# Patient Record
Sex: Male | Born: 1960 | Race: Black or African American | Hispanic: No | Marital: Married
Health system: Southern US, Community
[De-identification: ages and names within clinical notes are randomized; demographics above are authoritative.]

## PROBLEM LIST (undated history)

## (undated) DIAGNOSIS — E785 Hyperlipidemia, unspecified: Secondary | ICD-10-CM

## (undated) DIAGNOSIS — K219 Gastro-esophageal reflux disease without esophagitis: Secondary | ICD-10-CM

## (undated) DIAGNOSIS — Z8719 Personal history of other diseases of the digestive system: Secondary | ICD-10-CM

## (undated) DIAGNOSIS — I10 Essential (primary) hypertension: Secondary | ICD-10-CM

## (undated) DIAGNOSIS — C61 Malignant neoplasm of prostate: Secondary | ICD-10-CM

## (undated) DIAGNOSIS — R351 Nocturia: Secondary | ICD-10-CM

## (undated) DIAGNOSIS — G8929 Other chronic pain: Secondary | ICD-10-CM

## (undated) DIAGNOSIS — F1020 Alcohol dependence, uncomplicated: Secondary | ICD-10-CM

## (undated) HISTORY — PX: CYSTOSCOPY: SHX5120

## (undated) HISTORY — PX: OTHER SURGICAL HISTORY: SHX169

---

## 1998-01-27 ENCOUNTER — Emergency Department (HOSPITAL_COMMUNITY): Admission: EM | Admit: 1998-01-27 | Discharge: 1998-01-27 | Payer: Self-pay | Admitting: Emergency Medicine

## 1999-06-05 ENCOUNTER — Emergency Department (HOSPITAL_COMMUNITY): Admission: EM | Admit: 1999-06-05 | Discharge: 1999-06-05 | Payer: Self-pay | Admitting: *Deleted

## 1999-08-13 ENCOUNTER — Encounter: Admission: RE | Admit: 1999-08-13 | Discharge: 1999-08-13 | Payer: Self-pay | Admitting: Nephrology

## 1999-08-13 ENCOUNTER — Encounter: Payer: Self-pay | Admitting: Nephrology

## 2007-08-03 ENCOUNTER — Emergency Department (HOSPITAL_COMMUNITY): Admission: EM | Admit: 2007-08-03 | Discharge: 2007-08-03 | Payer: Self-pay | Admitting: Emergency Medicine

## 2012-10-19 ENCOUNTER — Encounter (HOSPITAL_COMMUNITY): Payer: Self-pay | Admitting: *Deleted

## 2012-10-19 ENCOUNTER — Emergency Department (HOSPITAL_COMMUNITY)
Admission: EM | Admit: 2012-10-19 | Discharge: 2012-10-19 | Disposition: A | Discharge status: Home / Self Care | Payer: Self-pay | Attending: Emergency Medicine | Admitting: Emergency Medicine

## 2012-10-19 DIAGNOSIS — K0889 Other specified disorders of teeth and supporting structures: Secondary | ICD-10-CM

## 2012-10-19 DIAGNOSIS — F17200 Nicotine dependence, unspecified, uncomplicated: Secondary | ICD-10-CM | POA: Insufficient documentation

## 2012-10-19 DIAGNOSIS — K089 Disorder of teeth and supporting structures, unspecified: Secondary | ICD-10-CM | POA: Insufficient documentation

## 2012-10-19 HISTORY — DX: Essential (primary) hypertension: I10

## 2012-10-19 MED ORDER — PENICILLIN V POTASSIUM 500 MG PO TABS: 500.0000 mg | ORAL_TABLET | Freq: Four times a day (QID) | ORAL | Status: DC

## 2012-10-19 MED ORDER — TRAMADOL HCL 50 MG PO TABS: 50.0000 mg | ORAL_TABLET | Freq: Four times a day (QID) | ORAL | Status: DC | PRN

## 2012-10-19 NOTE — ED Provider Notes (Signed)
CSN: [id number]     Arrival date & time 10/19/12  2022 History  This chart was scribed for non-physician practitioner working with Suzi Roots, MD, by Ardelia Mems ED Scribe. This patient was seen in room TR08C/TR08C and the patient's care was started at 10:00 PM.   First MD Initiated Contact with Patient 10/19/12 2139     Chief Complaint  Patient presents with  . Dental Pain  . Oral Swelling    The history is provided by the patient. No language interpreter was used.   HPI Comments: Colin Warner is a 52 y.o. male with a history of hypertension who presents to the Emergency Department complaining of right upper molar pain with associated oral swelling and right cheek swelling. He states that he has a broken tooth, which he believes may be impacted and that he tried to pull it out last night. He states that he woke up this morning with his increased pain and swelling. He reports one episode of emesis last night while trying to remove the tooth, but states that this is because he gagged on alcohol that he was drinking. He states that he has been addressing his pain by drinking beer. He states that he does not like taking pills or going to the dentist. He denies nausea, emesis other than due to gagging, fever, chills, ear pain, neck pain or any other symptoms.   Past Medical History  Diagnosis Date  . Hypertension    History reviewed. No pertinent past surgical history. No family history on file. History  Substance Use Topics  . Smoking status: Current Every Day Smoker  . Smokeless tobacco: Not on file  . Alcohol Use: Yes    Review of Systems  Constitutional: Negative for fever and chills.  HENT: Positive for facial swelling and dental problem. Negative for ear pain and neck pain.   All other systems reviewed and are negative.    Allergies  Review of patient's allergies indicates no known allergies.  Home Medications  No current outpatient prescriptions on file.  Triage  Vitals: BP 166/110  Pulse 94  Temp(Src) 98.2 F (36.8 C) (Oral)  Resp 18  SpO2 98%  Physical Exam  Nursing note and vitals reviewed. Constitutional: He is oriented to person, place, and time. He appears well-developed and well-nourished. No distress.  HENT:  Head: Normocephalic and atraumatic. No trismus in the jaw.  Right Ear: External ear normal.  Left Ear: External ear normal.  Nose: Nose normal.  Mouth/Throat: Uvula is midline, oropharynx is clear and moist and mucous membranes are normal. Abnormal dentition. Dental caries present. No dental abscesses or edematous.  Right back molar was loose, no drainage or abscess. No trismus, submental edema or tongue elevation. Mild swelling to right face. Very poor dentition. Many teeth missing.   Eyes: Conjunctivae are normal.  Neck: Normal range of motion. No tracheal deviation present.  Cardiovascular: Normal rate, regular rhythm and normal heart sounds.   Pulmonary/Chest: Effort normal and breath sounds normal. No stridor.  Abdominal: Soft. He exhibits no distension. There is no tenderness.  Musculoskeletal: Normal range of motion.  Neurological: He is alert and oriented to person, place, and time.  Skin: Skin is warm and dry. He is not diaphoretic.  Psychiatric: He has a normal mood and affect. His behavior is normal.    ED Course   Procedures (including critical care time)  DIAGNOSTIC STUDIES: Oxygen Saturation is 98% on RA, normal by my interpretation.    COORDINATION OF CARE:  10:31 PM- Pt advised of plan for to receive prescriptions for pain medication and antibiotics upon discharge and pt agrees. Pt advised to return if his symptoms worsen or if he develops fever serious symptoms. Pt urged to follow up with a dentist and pt agrees.  Labs Reviewed - No data to display  No results found.  1. Pain, dental     MDM  Patient with toothache.  No gross abscess.  Exam unconcerning for Ludwig's angina or spread of infection.   Will treat with penicillin and pain medicine.  Stressed multiple times the importance of following up with a dentist.      I personally performed the services described in this documentation, which was scribed in my presence. The recorded information has been reviewed and is accurate.    Mora Bellman, PA-C 10/20/12 [phone]

## 2012-10-19 NOTE — ED Notes (Signed)
R upper anterior tooth pain, mentions broken tooth, tried to pull it out last night, woke this morning with increased pain and swelling to R cheek/maxilla.

## 2012-10-23 NOTE — ED Provider Notes (Signed)
Medical screening examination/treatment/procedure(s) were performed by non-physician practitioner and as supervising physician I was immediately available for consultation/collaboration.   Margareth Kanner E Kaley Jutras, MD 10/23/12 0833 

## 2014-01-20 ENCOUNTER — Emergency Department (HOSPITAL_COMMUNITY)
Admission: EM | Admit: 2014-01-20 | Discharge: 2014-01-20 | Disposition: A | Discharge status: Home / Self Care | Payer: Self-pay | Attending: Emergency Medicine | Admitting: Emergency Medicine

## 2014-01-20 ENCOUNTER — Encounter (HOSPITAL_COMMUNITY): Payer: Self-pay | Admitting: Emergency Medicine

## 2014-01-20 DIAGNOSIS — I10 Essential (primary) hypertension: Secondary | ICD-10-CM | POA: Insufficient documentation

## 2014-01-20 DIAGNOSIS — H1813 Bullous keratopathy, bilateral: Secondary | ICD-10-CM | POA: Insufficient documentation

## 2014-01-20 DIAGNOSIS — M549 Dorsalgia, unspecified: Secondary | ICD-10-CM | POA: Insufficient documentation

## 2014-01-20 DIAGNOSIS — Z792 Long term (current) use of antibiotics: Secondary | ICD-10-CM | POA: Insufficient documentation

## 2014-01-20 DIAGNOSIS — Y9389 Activity, other specified: Secondary | ICD-10-CM | POA: Insufficient documentation

## 2014-01-20 DIAGNOSIS — H00016 Hordeolum externum left eye, unspecified eyelid: Secondary | ICD-10-CM | POA: Insufficient documentation

## 2014-01-20 DIAGNOSIS — X58XXXA Exposure to other specified factors, initial encounter: Secondary | ICD-10-CM | POA: Insufficient documentation

## 2014-01-20 DIAGNOSIS — Y929 Unspecified place or not applicable: Secondary | ICD-10-CM | POA: Insufficient documentation

## 2014-01-20 DIAGNOSIS — S0502XA Injury of conjunctiva and corneal abrasion without foreign body, left eye, initial encounter: Secondary | ICD-10-CM | POA: Insufficient documentation

## 2014-01-20 DIAGNOSIS — Z720 Tobacco use: Secondary | ICD-10-CM | POA: Insufficient documentation

## 2014-01-20 MED ORDER — ERYTHROMYCIN 5 MG/GM OP OINT: TOPICAL_OINTMENT | OPHTHALMIC | Status: DC

## 2014-01-20 MED ORDER — TETRACAINE HCL 0.5 % OP SOLN
2.0000 [drp] | Freq: Once | OPHTHALMIC | Status: AC
  Administered 2014-01-20: 2 [drp] via OPHTHALMIC
  Filled 2014-01-20: qty 2

## 2014-01-20 MED ORDER — KETOROLAC TROMETHAMINE 0.5 % OP SOLN: 1.0000 [drp] | Freq: Four times a day (QID) | OPHTHALMIC | Status: DC

## 2014-01-20 MED ORDER — FLUORESCEIN SODIUM 1 MG OP STRP
1.0000 | ORAL_STRIP | Freq: Once | OPHTHALMIC | Status: AC
  Administered 2014-01-20: 1 via OPHTHALMIC
  Filled 2014-01-20: qty 1

## 2014-01-20 NOTE — ED Notes (Signed)
Alert, NAD, calm, interactive, given water per request.
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
## 2014-01-20 NOTE — ED Provider Notes (Signed)
Medical screening examination/treatment/procedure(s) were performed by non-physician practitioner and as supervising physician I was immediately available for consultation/collaboration.   EKG Interpretation None       Charlesetta Shanks, MD 01/20/14 [phone]

## 2014-01-20 NOTE — ED Notes (Signed)
Tona Pen handed to [employer]

## 2014-01-20 NOTE — Discharge Instructions (Signed)
Return to the emergency room with worsening of symptoms, new symptoms or with symptoms that are concerning. Eye drops as prescribed Call to make appointment with opthamologist tomorrow to make appointment in 2 days. Number above. Continue to use benadryl and also use warm compresses to reduce the swelling.    Sty A sty (hordeolum) is an infection of a gland in the eyelid located at the base of the eyelash. A sty may develop a white or yellow head of pus. It can be puffy (swollen). Usually, the sty will burst and pus will come out on its own. They do not leave lumps in the eyelid once they drain. A sty is often confused with another form of cyst of the eyelid called a chalazion. Chalazions occur within the eyelid and not on the edge where the bases of the eyelashes are. They often are red, sore and then form firm lumps in the eyelid. CAUSES   Germs (bacteria).  Lasting (chronic) eyelid inflammation. SYMPTOMS   Tenderness, redness and swelling along the edge of the eyelid at the base of the eyelashes.  Sometimes, there is a white or yellow head of pus. It may or may not drain. DIAGNOSIS  An ophthalmologist will be able to distinguish between a sty and a chalazion and treat the condition appropriately.  TREATMENT   Styes are typically treated with warm packs (compresses) until drainage occurs.  In rare cases, medicines that kill germs (antibiotics) may be prescribed. These antibiotics may be in the form of drops, cream or pills.  If a hard lump has formed, it is generally necessary to do a small incision and remove the hardened contents of the cyst in a minor surgical procedure done in the office.  In suspicious cases, your caregiver may send the contents of the cyst to the lab to be certain that it is not a rare, but dangerous form of cancer of the glands of the eyelid. HOME CARE INSTRUCTIONS   Wash your hands often and dry them with a clean towel. Avoid touching your eyelid. This may  spread the infection to other parts of the eye.  Apply heat to your eyelid for 10 to 20 minutes, several times a day, to ease pain and help to heal it faster.  Do not squeeze the sty. Allow it to drain on its own. Wash your eyelid carefully 3 to 4 times per day to remove any pus. SEEK IMMEDIATE MEDICAL CARE IF:   Your eye becomes painful or puffy (swollen).  Your vision changes.  Your sty does not drain by itself within 3 days.  Your sty comes back within a short period of time, even with treatment.  You have redness (inflammation) around the eye.  You have a fever. Document Released: 12/22/2004 Document Revised: 06/06/2011 Document Reviewed: 06/28/2013 [employer] Patient Information 2015 [place], [place]. This information is not intended to replace advice given to you by your health care provider. Make sure you discuss any questions you have with your health care provider.

## 2014-01-20 NOTE — ED Provider Notes (Signed)
CSN: [id number]     Arrival date & time 01/20/14  1416 History  This chart was scribed for Al Corpus, PA-C working with Charlesetta Shanks, MD by Randa Evens, ED Scribe. This patient was seen in room TR04C/TR04C and the patient's care was started at 6:22 PM.      Chief Complaint  Patient presents with  . Eye Pain  . Facial Pain   Patient is a 53 y.o. male presenting with eye pain. The history is provided by the patient. No language interpreter was used.  Eye Pain Pertinent negatives include no headaches.   HPI Comments: Colin Warner is a 53 y.o. male with PMHx of HTN who presents to the Emergency Department complaining of left eye pain onset 1 week ago. Patient complaining of a bump with swelling. He states he is having associated left eye white discharge. He states that he is having difficulty seeing out of the left eye but is not wearing his corrective lenses . No contact lenses. He states that he has been taking benadryl wit no relief. He states that he is also having some back pain but this is similar to previous back pain without worsening. Denies any new visual changes, headaches, slurred speech, weakness, fever or chills.      Past Medical History  Diagnosis Date  . Hypertension    History reviewed. No pertinent past surgical history. History reviewed. No pertinent family history. History  Substance Use Topics  . Smoking status: Current Every Day Smoker  . Smokeless tobacco: Not on file  . Alcohol Use: Yes    Review of Systems  Constitutional: Negative for fever and chills.  Eyes: Positive for pain and discharge. Negative for visual disturbance.  Musculoskeletal: Positive for back pain.  Neurological: Negative for speech difficulty, weakness and headaches.  All other systems reviewed and are negative.   Allergies  Review of patient's allergies indicates no known allergies.  Home Medications   Prior to Admission medications   Medication Sig Start Date End  Date Taking? Authorizing Provider  erythromycin ophthalmic ointment Place a 1/2 inch ribbon of ointment into the lower eyelid. BOTH EYES 01/20/14   Pura Spice, PA-C  ketorolac (ACULAR) 0.5 % ophthalmic solution Place 1 drop into both eyes every 6 (six) hours. 01/20/14   Pura Spice, PA-C   Triage Vitals: BP 175/106  Pulse 67  Temp(Src) 98.4 F (36.9 C) (Oral)  Resp 20  Ht 5\' 5"  (1.651 m)  Wt 140 lb (63.504 kg)  BMI 23.30 kg/m2  SpO2 99%  Physical Exam  Nursing note and vitals reviewed. Constitutional: He appears well-developed and well-nourished. No distress.  HENT:  Head: Normocephalic and atraumatic.  Right Ear: Tympanic membrane and external ear normal.  Left Ear: Tympanic membrane and external ear normal.  Mouth/Throat: Oropharynx is clear and moist.  No facial or neck swelling, no neck masses.   Eyes: EOM are normal. Pupils are equal, round, and reactive to light. Lids are everted and swept, no foreign bodies found. Right eye exhibits no discharge and no exudate. No foreign body present in the right eye. Left eye exhibits no discharge and no exudate. No foreign body present in the left eye. Right conjunctiva is not injected. Right conjunctiva has no hemorrhage. Left conjunctiva is injected. Left conjunctiva has no hemorrhage.  Bilateral Near: 20/30 ; R Near: 20/30 ; L Near: 20/40 R IOP 17 L IOP 15 EOMI without pain or entrapment. Patient with hordeolum to left mid upper eyelid. Patient with  fluorescein uptake in bilateral eyes.  R with corneal abrasion. L with uptake but appears to be flat clear plaque on eye which does not appear to be an ulcer. Possibly Bullous keratopathy  Cardiovascular: Normal rate, regular rhythm and normal heart sounds.   Pulmonary/Chest: Effort normal and breath sounds normal. No respiratory distress. He has no wheezes.  Abdominal: Soft. Bowel sounds are normal. He exhibits no distension. There is no tenderness.  Neurological: He is alert.  He exhibits normal muscle tone. Coordination normal.  Skin: Skin is warm and dry. He is not diaphoretic.    ED Course  Procedures (including critical care time) DIAGNOSTIC STUDIES: Oxygen Saturation is 99% on RA, normal by my interpretation.    COORDINATION OF CARE: 6:38 PM-Discussed treatment plan which includes eye exam with pt at bedside and pt agreed to plan.     Labs Review Labs Reviewed - No data to display  Imaging Review No results found.   EKG Interpretation None     Meds given in ED:  Medications  tetracaine (PONTOCAINE) 0.5 % ophthalmic solution 2 drop (2 drops Both Eyes Given 01/20/14 1911)  fluorescein ophthalmic strip 1 strip (1 strip Both Eyes Given 01/20/14 1912)    Discharge Medication List as of 01/20/2014  8:03 PM    START taking these medications   Details  erythromycin ophthalmic ointment Place a 1/2 inch ribbon of ointment into the lower eyelid. BOTH EYES, Print    ketorolac (ACULAR) 0.5 % ophthalmic solution Place 1 drop into both eyes every 6 (six) hours., Starting 01/20/2014, Until Discontinued, Print          MDM   Final diagnoses:  Hordeolum external, left  Bullous keratopathy, bilateral  Left corneal abrasion, initial encounter   Patient with left hordeolum of upper eyelid. tx with warm compresses, antihistamines. Patient with bilateral possible bullous keratopathy treat with meds above and follow up with opthalmology in 2 days. Pt also with corneal abrasion to right eye. tx with scripts above and follow up with opthalmology.  Discussed return precautions with patient. Discussed all results and patient verbalizes understanding and agrees with plan.  I personally performed the services described in this documentation, which was scribed in my presence. The recorded information has been reviewed and is accurate.       Pura Spice, PA-C 01/20/14 2135  Pura Spice, PA-C 01/20/14 2136

## 2014-06-29 ENCOUNTER — Encounter (HOSPITAL_COMMUNITY): Payer: Self-pay | Admitting: Emergency Medicine

## 2014-06-29 ENCOUNTER — Emergency Department (HOSPITAL_COMMUNITY)
Admission: EM | Admit: 2014-06-29 | Discharge: 2014-06-29 | Discharge status: Against Medical Advice | Payer: Self-pay | Attending: Emergency Medicine | Admitting: Emergency Medicine

## 2014-06-29 DIAGNOSIS — Z720 Tobacco use: Secondary | ICD-10-CM | POA: Insufficient documentation

## 2014-06-29 DIAGNOSIS — Y9289 Other specified places as the place of occurrence of the external cause: Secondary | ICD-10-CM | POA: Insufficient documentation

## 2014-06-29 DIAGNOSIS — Y998 Other external cause status: Secondary | ICD-10-CM | POA: Insufficient documentation

## 2014-06-29 DIAGNOSIS — S0993XA Unspecified injury of face, initial encounter: Secondary | ICD-10-CM | POA: Insufficient documentation

## 2014-06-29 DIAGNOSIS — I10 Essential (primary) hypertension: Secondary | ICD-10-CM | POA: Insufficient documentation

## 2014-06-29 DIAGNOSIS — Y9389 Activity, other specified: Secondary | ICD-10-CM | POA: Insufficient documentation
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
## 2014-06-29 NOTE — ED Notes (Signed)
Pt states he has consumed (3) 40 oz beers this evening.
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
## 2014-07-01 ENCOUNTER — Encounter (HOSPITAL_COMMUNITY): Payer: Self-pay | Admitting: *Deleted

## 2014-07-01 ENCOUNTER — Emergency Department (HOSPITAL_COMMUNITY)
Admission: EM | Admit: 2014-07-01 | Discharge: 2014-07-01 | Disposition: A | Discharge status: Home / Self Care | Payer: Self-pay | Attending: Emergency Medicine | Admitting: Emergency Medicine

## 2014-07-01 ENCOUNTER — Emergency Department (HOSPITAL_COMMUNITY): Payer: Self-pay

## 2014-07-01 DIAGNOSIS — Y9389 Activity, other specified: Secondary | ICD-10-CM | POA: Insufficient documentation

## 2014-07-01 DIAGNOSIS — Y998 Other external cause status: Secondary | ICD-10-CM | POA: Insufficient documentation

## 2014-07-01 DIAGNOSIS — Y9289 Other specified places as the place of occurrence of the external cause: Secondary | ICD-10-CM | POA: Insufficient documentation

## 2014-07-01 DIAGNOSIS — R42 Dizziness and giddiness: Secondary | ICD-10-CM | POA: Insufficient documentation

## 2014-07-01 DIAGNOSIS — I10 Essential (primary) hypertension: Secondary | ICD-10-CM | POA: Insufficient documentation

## 2014-07-01 DIAGNOSIS — R0789 Other chest pain: Secondary | ICD-10-CM

## 2014-07-01 DIAGNOSIS — S0081XA Abrasion of other part of head, initial encounter: Secondary | ICD-10-CM | POA: Insufficient documentation

## 2014-07-01 DIAGNOSIS — T148XXA Other injury of unspecified body region, initial encounter: Secondary | ICD-10-CM

## 2014-07-01 DIAGNOSIS — S299XXA Unspecified injury of thorax, initial encounter: Secondary | ICD-10-CM | POA: Insufficient documentation

## 2014-07-01 MED ORDER — OXYCODONE-ACETAMINOPHEN 5-325 MG PO TABS: 1.0000 | ORAL_TABLET | Freq: Four times a day (QID) | ORAL | Status: DC | PRN

## 2014-07-01 MED ORDER — OXYCODONE-ACETAMINOPHEN 5-325 MG PO TABS
1.0000 | ORAL_TABLET | Freq: Once | ORAL | Status: AC
  Administered 2014-07-01: 1 via ORAL
  Filled 2014-07-01: qty 1

## 2014-07-01 NOTE — Progress Notes (Signed)
EDCM spoke to patient at bedside. Patient confirms he does not have a pcp or insurance living in [place].  [facility] provided patient with pamphlet to [facility], informed patient of services there and walk in times.  EDCM also provided patient with list of pcps who accept self pay patients, list of discount pharmacies and websites needymeds.org and GoodRX.com for medication assistance, phone number to inquire about the orange card, phone number to inquire about [employer], phone number to inquire about the [place], financial resources in the community such as local churches, [employer], urban ministries, and dental assistance for uninsured patients.  Patient thankful for resources.  No further EDCM needs at this time.
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
## 2015-05-02 ENCOUNTER — Encounter (HOSPITAL_COMMUNITY): Payer: Self-pay | Admitting: Emergency Medicine

## 2015-05-02 ENCOUNTER — Emergency Department (INDEPENDENT_AMBULATORY_CARE_PROVIDER_SITE_OTHER)
Admission: EM | Admit: 2015-05-02 | Discharge: 2015-05-02 | Disposition: A | Discharge status: Home / Self Care | Payer: Self-pay | Source: Home / Self Care | Attending: Family Medicine | Admitting: Family Medicine

## 2015-05-02 DIAGNOSIS — E785 Hyperlipidemia, unspecified: Secondary | ICD-10-CM

## 2015-05-02 DIAGNOSIS — I10 Essential (primary) hypertension: Secondary | ICD-10-CM

## 2015-05-02 MED ORDER — SIMVASTATIN 20 MG PO TABS: 20.0000 mg | ORAL_TABLET | Freq: Every day | ORAL | Status: DC

## 2015-05-02 MED ORDER — LISINOPRIL 20 MG PO TABS: 20.0000 mg | ORAL_TABLET | Freq: Every day | ORAL | Status: DC

## 2015-05-02 NOTE — Discharge Instructions (Signed)
It was nice meeting you. I am sorry about the loss of your PCP. I have given you a 3 month worth of your medications till you are able to re-establish health care. Follow up with [place] as needed.   Fat and Cholesterol Restricted Diet Getting too much fat and cholesterol in your diet may cause health problems. Following this diet helps keep your fat and cholesterol at normal levels. This can keep you from getting sick. WHAT TYPES OF FAT SHOULD I CHOOSE?  Choose monosaturated and polyunsaturated fats. These are found in foods such as olive oil, canola oil, flaxseeds, walnuts, almonds, and seeds.  Eat more omega-3 fats. Good choices include salmon, mackerel, sardines, tuna, flaxseed oil, and ground flaxseeds.  Limit saturated fats. These are in animal products such as meats, butter, and cream. They can also be in plant products such as palm oil, palm kernel oil, and coconut oil.   Avoid foods with partially hydrogenated oils in them. These contain trans fats. Examples of foods that have trans fats are stick margarine, some tub margarines, cookies, crackers, and other baked goods. WHAT GENERAL GUIDELINES DO I NEED TO FOLLOW?   Check food labels. Look for the words "trans fat" and "saturated fat."  When preparing a meal:  Fill half of your plate with vegetables and green salads.  Fill one fourth of your plate with whole grains. Look for the word "whole" as the first word in the ingredient list.  Fill one fourth of your plate with lean protein foods.  Limit fruit to two servings a day. Choose fruit instead of juice.  Eat more foods with soluble fiber. Examples of foods with this type of fiber are apples, broccoli, carrots, beans, peas, and barley. Try to get 20-30 g (grams) of fiber per day.  Eat more home-cooked foods. Eat less at restaurants and buffets.  Limit or avoid alcohol.  Limit foods high in starch and sugar.  Limit fried foods.  Cook foods without frying them. Baking, boiling,  grilling, and broiling are all great options.  Lose weight if you are overweight. Losing even a small amount of weight can help your overall health. It can also help prevent diseases such as diabetes and heart disease. WHAT FOODS CAN I EAT? Grains Whole grains, such as whole wheat or whole grain breads, crackers, cereals, and pasta. Unsweetened oatmeal, bulgur, barley, quinoa, or brown rice. Corn or whole wheat flour tortillas. Vegetables Fresh or frozen vegetables (raw, steamed, roasted, or grilled). Green salads. Fruits All fresh, canned (in natural juice), or frozen fruits. Meat and Other Protein Products Ground beef (85% or leaner), grass-fed beef, or beef trimmed of fat. Skinless chicken or [place]. Ground chicken or [place]. Pork trimmed of fat. All fish and seafood. Eggs. Dried beans, peas, or lentils. Unsalted nuts or seeds. Unsalted canned or dry beans. Dairy Low-fat dairy products, such as skim or 1% milk, 2% or reduced-fat cheeses, low-fat ricotta or cottage cheese, or plain low-fat yogurt. Fats and Oils Tub margarines without trans fats. Light or reduced-fat mayonnaise and salad dressings. Avocado. Olive, canola, sesame, or safflower oils. Natural peanut or almond butter (choose ones without added sugar and oil). The items listed above may not be a complete list of recommended foods or beverages. Contact your dietitian for more options. WHAT FOODS ARE NOT RECOMMENDED? Grains White bread. White pasta. White rice. Cornbread. Bagels, pastries, and croissants. Crackers that contain trans fat. Vegetables White potatoes. Corn. Creamed or fried vegetables. Vegetables in a cheese sauce. Fruits  Dried fruits. Canned fruit in light or heavy syrup. Fruit juice. Meat and Other Protein Products Fatty cuts of meat. Ribs, chicken wings, bacon, sausage, bologna, salami, chitterlings, fatback, hot dogs, bratwurst, and packaged luncheon meats. Liver and organ meats. Dairy Whole or 2% milk, cream,  half-and-half, and cream cheese. Whole milk cheeses. Whole-fat or sweetened yogurt. Full-fat cheeses. Nondairy creamers and whipped toppings. Processed cheese, cheese spreads, or cheese curds. Sweets and Desserts Corn syrup, sugars, honey, and molasses. Candy. Jam and jelly. Syrup. Sweetened cereals. Cookies, pies, cakes, donuts, muffins, and ice cream. Fats and Oils Butter, stick margarine, lard, shortening, ghee, or bacon fat. Coconut, palm kernel, or palm oils. Beverages Alcohol. Sweetened drinks (such as sodas, lemonade, and fruit drinks or punches). The items listed above may not be a complete list of foods and beverages to avoid. Contact your dietitian for more information.   This information is not intended to replace advice given to you by your health care provider. Make sure you discuss any questions you have with your health care provider.   Document Released: 09/13/2011 Document Revised: 04/04/2014 Document Reviewed: 06/13/2013 Elsevier Interactive Patient Education [employer].

## 2015-05-02 NOTE — ED Notes (Signed)
Pt here for medication refills Lisinopril 20 mg tab and Simvastatin 20 mg tab Ran out almost 2 months ago after PCP died  Asymptomatic, everyday 1 pack smoker

## 2015-05-02 NOTE — ED Provider Notes (Signed)
CSN: [id number]     Arrival date & time 05/02/15  1423 History   First MD Initiated Contact with Patient 05/02/15 1535     Chief Complaint  Patient presents with  . Medication Refill  . Hypertension   (Consider location/radiation/quality/duration/timing/severity/associated sxs/prior Treatment) Patient is a 55 y.o. male presenting with hypertension. The history is provided by the patient. No language interpreter was used.  Hypertension This is a chronic problem. Episode onset: 2 years or more. The problem occurs daily. The problem has not changed since onset.Pertinent negatives include no chest pain, no abdominal pain, no headaches and no shortness of breath. Associated symptoms comments: fatigue. The symptoms are aggravated by smoking. Treatments tried: He is supposed to be on Lisinopril 20 mg daily. The last time he took his medication was more than 1 month ago since his doctor died and he has not been able to get his refill.  HLD: He is also on Simvastatin 20 mg qhs for hyperlipidemia but he has been out of meds and he is needing refill. No other concern today.   Past Medical History  Diagnosis Date  . Hypertension    History reviewed. No pertinent past surgical history. No family history on file. Social History  Substance Use Topics  . Smoking status: Current Every Day Smoker -- 0.50 packs/day    Types: Cigarettes  . Smokeless tobacco: Never Used  . Alcohol Use: Yes     Comment: 40oz daily, more on weekends    Review of Systems  Eyes: Negative.   Respiratory: Negative.  Negative for shortness of breath.   Cardiovascular: Negative.  Negative for chest pain.  Gastrointestinal: Negative.  Negative for abdominal pain.  Genitourinary: Negative.   Musculoskeletal: Negative.   Neurological: Negative for headaches.  All other systems reviewed and are negative.   Allergies  Review of patient's allergies indicates no known allergies.  Home Medications   Prior to Admission  medications   Medication Sig Start Date End Date Taking? Authorizing Provider  erythromycin ophthalmic ointment Place a 1/2 inch ribbon of ointment into the lower eyelid. BOTH EYES Patient not taking: Reported on 07/01/2014 01/20/14   Al Corpus, PA-C  ketorolac (ACULAR) 0.5 % ophthalmic solution Place 1 drop into both eyes every 6 (six) hours. Patient not taking: Reported on 07/01/2014 01/20/14   Al Corpus, PA-C  oxyCODONE-acetaminophen (PERCOCET) 5-325 MG per tablet Take 1 tablet by mouth every 6 (six) hours as needed for moderate pain. 07/01/14   Pamella Pert, MD   Meds Ordered and Administered this Visit  Medications - No data to display  BP 178/100 mmHg  Pulse 76  Temp(Src) 97.9 F (36.6 C) (Oral)  SpO2 100% No data found.   Physical Exam  Constitutional: He is oriented to person, place, and time. He appears well-developed. No distress.  Cardiovascular: Normal rate, regular rhythm and normal heart sounds.   No murmur heard. Pulmonary/Chest: Effort normal and breath sounds normal. No respiratory distress. He has no wheezes.  Abdominal: Soft. Bowel sounds are normal. He exhibits no distension and no mass. There is no tenderness.  Musculoskeletal: He exhibits no edema.  Neurological: He is alert and oriented to person, place, and time.  Nursing note and vitals reviewed.   ED Course  Procedures (including critical care time)  Labs Review Labs Reviewed - No data to display  Imaging Review No results found.   Visual Acuity Review  Right Eye Distance:   Left Eye Distance:   Bilateral Distance:  Right Eye Near:   Left Eye Near:    Bilateral Near:         MDM  No diagnosis found. Essential hypertension  Hyperlipidemia    Patient has been out of his BP meds for more than 1 month hence his BP is pretty elevated today. I gave him a 3 month worth of his Lisinopril to be re-started today. I also reemphasize the need to continue to cut back on tobacco  smoking for his overall health.  I refilled his Simvastatin for his HLD today as well. Patient encouraged to re-establish care with a new provider as soon as possible. He verbalized understanding.    Kinnie Feil, MD 05/02/15 [phone]

## 2015-11-27 DIAGNOSIS — Z9289 Personal history of other medical treatment: Secondary | ICD-10-CM

## 2015-11-27 HISTORY — DX: Personal history of other medical treatment: Z92.89

## 2015-12-13 ENCOUNTER — Encounter (HOSPITAL_COMMUNITY): Payer: Self-pay

## 2015-12-13 ENCOUNTER — Observation Stay (HOSPITAL_COMMUNITY): Payer: Self-pay

## 2015-12-13 ENCOUNTER — Inpatient Hospital Stay (HOSPITAL_COMMUNITY)
Admission: EM | Admit: 2015-12-13 | Discharge: 2015-12-15 | DRG: 378 | Disposition: A | Discharge status: Home / Self Care | Payer: Self-pay | Attending: Internal Medicine | Admitting: Internal Medicine

## 2015-12-13 DIAGNOSIS — M25462 Effusion, left knee: Secondary | ICD-10-CM | POA: Diagnosis present

## 2015-12-13 DIAGNOSIS — D62 Acute posthemorrhagic anemia: Secondary | ICD-10-CM | POA: Diagnosis present

## 2015-12-13 DIAGNOSIS — K264 Chronic or unspecified duodenal ulcer with hemorrhage: Principal | ICD-10-CM | POA: Diagnosis present

## 2015-12-13 DIAGNOSIS — I10 Essential (primary) hypertension: Secondary | ICD-10-CM | POA: Diagnosis present

## 2015-12-13 DIAGNOSIS — M25562 Pain in left knee: Secondary | ICD-10-CM | POA: Diagnosis present

## 2015-12-13 DIAGNOSIS — F1020 Alcohol dependence, uncomplicated: Secondary | ICD-10-CM | POA: Diagnosis present

## 2015-12-13 DIAGNOSIS — K922 Gastrointestinal hemorrhage, unspecified: Secondary | ICD-10-CM | POA: Diagnosis present

## 2015-12-13 DIAGNOSIS — K920 Hematemesis: Secondary | ICD-10-CM

## 2015-12-13 DIAGNOSIS — F17210 Nicotine dependence, cigarettes, uncomplicated: Secondary | ICD-10-CM | POA: Diagnosis present

## 2015-12-13 DIAGNOSIS — E785 Hyperlipidemia, unspecified: Secondary | ICD-10-CM | POA: Diagnosis present

## 2015-12-13 DIAGNOSIS — E869 Volume depletion, unspecified: Secondary | ICD-10-CM | POA: Diagnosis present

## 2015-12-13 DIAGNOSIS — E872 Acidosis: Secondary | ICD-10-CM | POA: Diagnosis present

## 2015-12-13 DIAGNOSIS — Z79899 Other long term (current) drug therapy: Secondary | ICD-10-CM

## 2015-12-13 DIAGNOSIS — Z7982 Long term (current) use of aspirin: Secondary | ICD-10-CM

## 2015-12-13 LAB — COMPREHENSIVE METABOLIC PANEL
ALK PHOS: 49 U/L (ref 38–126)
ALT: 11 U/L — ABNORMAL LOW (ref 17–63)
ANION GAP: 7 (ref 5–15)
AST: 17 U/L (ref 15–41)
Albumin: 3.2 g/dL — ABNORMAL LOW (ref 3.5–5.0)
BUN: 37 mg/dL — ABNORMAL HIGH (ref 6–20)
CO2: 25 mmol/L (ref 22–32)
Calcium: 8.6 mg/dL — ABNORMAL LOW (ref 8.9–10.3)
Chloride: 104 mmol/L (ref 101–111)
Creatinine, Ser: 1.04 mg/dL (ref 0.61–1.24)
Glucose, Bld: 123 mg/dL — ABNORMAL HIGH (ref 65–99)
Potassium: 3.9 mmol/L (ref 3.5–5.1)
SODIUM: 136 mmol/L (ref 135–145)
TOTAL PROTEIN: 6.1 g/dL — AB (ref 6.5–8.1)
Total Bilirubin: 0.7 mg/dL (ref 0.3–1.2)

## 2015-12-13 LAB — CBC WITH DIFFERENTIAL/PLATELET
Basophils Absolute: 0 10*3/uL (ref 0.0–0.1)
Basophils Relative: 0 %
EOS ABS: 0 10*3/uL (ref 0.0–0.7)
Eosinophils Relative: 0 %
HCT: 29.3 % — ABNORMAL LOW (ref 39.0–52.0)
HEMOGLOBIN: 9.5 g/dL — AB (ref 13.0–17.0)
LYMPHS ABS: 1.8 10*3/uL (ref 0.7–4.0)
Lymphocytes Relative: 12 %
MCH: 28.7 pg (ref 26.0–34.0)
MCHC: 32.4 g/dL (ref 30.0–36.0)
MCV: 88.5 fL (ref 78.0–100.0)
MONOS PCT: 4 %
Monocytes Absolute: 0.6 10*3/uL (ref 0.1–1.0)
NEUTROS PCT: 84 %
Neutro Abs: 12.4 10*3/uL — ABNORMAL HIGH (ref 1.7–7.7)
Platelets: 415 10*3/uL — ABNORMAL HIGH (ref 150–400)
RBC: 3.31 MIL/uL — ABNORMAL LOW (ref 4.22–5.81)
RDW: 13.7 % (ref 11.5–15.5)
WBC: 14.9 10*3/uL — ABNORMAL HIGH (ref 4.0–10.5)

## 2015-12-13 LAB — URINALYSIS, ROUTINE W REFLEX MICROSCOPIC
Bilirubin Urine: NEGATIVE
GLUCOSE, UA: NEGATIVE mg/dL
Hgb urine dipstick: NEGATIVE
KETONES UR: NEGATIVE mg/dL
Leukocytes, UA: NEGATIVE
Nitrite: NEGATIVE
Protein, ur: NEGATIVE mg/dL
SPECIFIC GRAVITY, URINE: 1.021 (ref 1.005–1.030)
pH: 6 (ref 5.0–8.0)

## 2015-12-13 LAB — POC OCCULT BLOOD, ED: Fecal Occult Bld: POSITIVE — AB

## 2015-12-13 LAB — ABO/RH: ABO/RH(D): A POS

## 2015-12-13 LAB — I-STAT CG4 LACTIC ACID, ED: Lactic Acid, Venous: 2.7 mmol/L (ref 0.5–1.9)

## 2015-12-13 LAB — LIPASE, BLOOD: Lipase: 25 U/L (ref 11–51)

## 2015-12-13 MED ORDER — SODIUM CHLORIDE 0.9 % IV SOLN
50.0000 ug/h | INTRAVENOUS | Status: DC
  Administered 2015-12-13: 50 ug/h via INTRAVENOUS
  Filled 2015-12-13 (×5): qty 1

## 2015-12-13 MED ORDER — KCL IN DEXTROSE-NACL 20-5-0.45 MEQ/L-%-% IV SOLN
INTRAVENOUS | Status: DC
  Administered 2015-12-14 (×2): via INTRAVENOUS
  Filled 2015-12-13 (×2): qty 1000

## 2015-12-13 MED ORDER — ACETAMINOPHEN 650 MG RE SUPP: 650.0000 mg | Freq: Four times a day (QID) | RECTAL | Status: DC | PRN

## 2015-12-13 MED ORDER — LORAZEPAM 2 MG/ML IJ SOLN
1.0000 mg | Freq: Four times a day (QID) | INTRAMUSCULAR | Status: DC | PRN
  Filled 2015-12-13: qty 1

## 2015-12-13 MED ORDER — SODIUM CHLORIDE 0.9 % IV BOLUS (SEPSIS)
1000.0000 mL | Freq: Once | INTRAVENOUS | Status: AC
  Administered 2015-12-13: 1000 mL via INTRAVENOUS

## 2015-12-13 MED ORDER — ADULT MULTIVITAMIN W/MINERALS CH
1.0000 | ORAL_TABLET | Freq: Every day | ORAL | Status: DC
  Administered 2015-12-14 – 2015-12-15 (×2): 1 via ORAL
  Filled 2015-12-13 (×2): qty 1

## 2015-12-13 MED ORDER — SODIUM CHLORIDE 0.9 % IV SOLN
80.0000 mg | Freq: Once | INTRAVENOUS | Status: AC
  Administered 2015-12-13: 80 mg via INTRAVENOUS
  Filled 2015-12-13: qty 80

## 2015-12-13 MED ORDER — OCTREOTIDE LOAD VIA INFUSION
50.0000 ug | Freq: Once | INTRAVENOUS | Status: AC
  Administered 2015-12-13: 50 ug via INTRAVENOUS
  Filled 2015-12-13: qty 25

## 2015-12-13 MED ORDER — FOLIC ACID 1 MG PO TABS
1.0000 mg | ORAL_TABLET | Freq: Every day | ORAL | Status: DC
  Administered 2015-12-14 – 2015-12-15 (×2): 1 mg via ORAL
  Filled 2015-12-13 (×2): qty 1

## 2015-12-13 MED ORDER — LORAZEPAM 1 MG PO TABS: 1.0000 mg | ORAL_TABLET | Freq: Four times a day (QID) | ORAL | Status: DC | PRN

## 2015-12-13 MED ORDER — ACETAMINOPHEN 325 MG PO TABS
650.0000 mg | ORAL_TABLET | Freq: Four times a day (QID) | ORAL | Status: DC | PRN
Start: 2015-12-13 — End: 2015-12-15
  Administered 2015-12-15: 650 mg via ORAL
  Filled 2015-12-13: qty 2

## 2015-12-13 MED ORDER — DEXTROSE 5 % IV SOLN
1.0000 g | Freq: Once | INTRAVENOUS | Status: AC
  Administered 2015-12-13: 1 g via INTRAVENOUS
  Filled 2015-12-13: qty 10

## 2015-12-13 MED ORDER — FAMOTIDINE IN NACL 20-0.9 MG/50ML-% IV SOLN
20.0000 mg | Freq: Two times a day (BID) | INTRAVENOUS | Status: DC
  Filled 2015-12-13: qty 50

## 2015-12-13 MED ORDER — DEXTROSE 5 % IV SOLN
2.0000 g | INTRAVENOUS | Status: DC
  Administered 2015-12-14: 2 g via INTRAVENOUS
  Filled 2015-12-13 (×2): qty 2

## 2015-12-13 MED ORDER — THIAMINE HCL 100 MG/ML IJ SOLN: 100.0000 mg | Freq: Every day | INTRAMUSCULAR | Status: DC

## 2015-12-13 MED ORDER — PANTOPRAZOLE SODIUM 40 MG IV SOLR
40.0000 mg | Freq: Two times a day (BID) | INTRAVENOUS | Status: DC
  Administered 2015-12-13 – 2015-12-15 (×4): 40 mg via INTRAVENOUS
  Filled 2015-12-13 (×4): qty 40

## 2015-12-13 MED ORDER — ONDANSETRON HCL 4 MG/2ML IJ SOLN: 4.0000 mg | Freq: Four times a day (QID) | INTRAMUSCULAR | Status: DC | PRN

## 2015-12-13 MED ORDER — FAMOTIDINE IN NACL 20-0.9 MG/50ML-% IV SOLN
20.0000 mg | Freq: Two times a day (BID) | INTRAVENOUS | Status: DC
  Administered 2015-12-13: 20 mg via INTRAVENOUS

## 2015-12-13 MED ORDER — ONDANSETRON HCL 4 MG/2ML IJ SOLN
4.0000 mg | Freq: Once | INTRAMUSCULAR | Status: AC
  Administered 2015-12-13: 4 mg via INTRAVENOUS
  Filled 2015-12-13: qty 2

## 2015-12-13 MED ORDER — VITAMIN B-1 100 MG PO TABS
100.0000 mg | ORAL_TABLET | Freq: Every day | ORAL | Status: DC
  Administered 2015-12-14 – 2015-12-15 (×2): 100 mg via ORAL
  Filled 2015-12-13 (×2): qty 1

## 2015-12-13 MED ORDER — ONDANSETRON HCL 4 MG PO TABS: 4.0000 mg | ORAL_TABLET | Freq: Four times a day (QID) | ORAL | Status: DC | PRN

## 2015-12-13 NOTE — ED Provider Notes (Signed)
I saw and evaluated the patient, reviewed the resident's note and I agree with the findings and plan.  Pertinent History: The patient is a 55 year old male, he presents after having several days of black tarry stools, abdominal cramping and then today started to have some vomiting of bright red blood. The patient is a known alcoholic, he drinks over 123456 ounces of beer a day, he does not drink liquor or wine. This occurred just prior to arrival, was severe, there is large amounts of blood according to the patient. He does feel lightheaded.  Pertinent Exam findings: On exam the patient is tachycardic to 120, he has a minimally tender epigastrium but non-peritoneal abdomen, there is no distention or signs of ascites, his lungs are clear, his heart is tachycardic but no obvious murmurs, he has poor dentition but no blood in the oropharynx. His phonation is normal. He does have a left knee joint effusion, he has been taking some aspirin for that. On exam he has decreased range of motion of left knee with a clinical effusion. The right knee appears normal.  Consider esophageal varices gastritis, possibly related to peptic ulcer disease given his NSAID use as well as his alcohol use. We'll start octreotide drip, Protonix, the patient will need to be typed and screened, obvious admission to the hospital, the patient is critically ill with a significant upper GI bleed.  CRITICAL CARE Performed by: Johnna Acosta Total critical care time: 35 minutes Critical care time was exclusive of separately billable procedures and treating other patients. Critical care was necessary to treat or prevent imminent or life-threatening deterioration. Critical care was time spent personally by me on the following activities: development of treatment plan with patient and/or surrogate as well as nursing, discussions with consultants, evaluation of patient's response to treatment, examination of patient, obtaining history from  patient or surrogate, ordering and performing treatments and interventions, ordering and review of laboratory studies, ordering and review of radiographic studies, pulse oximetry and re-evaluation of patient's condition.   I personally interpreted the EKG as well as the resident and agree with the interpretation on the resident's chart.  Final diagnoses:  Hematemesis with nausea  Acute upper GI bleed      Noemi Chapel, MD 12/16/15 [phone]

## 2015-12-13 NOTE — H&P (Signed)
History and Physical  Patient Name: Colin Warner     LGX:[id number]    DOB: [birth year]    DOA: 12/13/2015 PCP: No PCP Per Patient   Patient coming from: Home  Chief Complaint: Hematemesis and melena  HPI: Colin Warner is a 55 y.o. male with a past medical history significant for alcoholism and HTN who presents with hematemesis.  The patient drinks 2-3 40 ounce beers daily and has for 10 years, but was otherwise in his normal state of health until 3 days ago when he had several dark runny stools. Then yesterday (Saturday) his stomach felt "tight" and uncomfortable, but he had no further problems and this resolved until the day of admission, when he went to the bathroom because of nausea in the afternoon, suddenly felt dizzy and vomited a large amount of bright red blood, then came to the ER.  ED course: -Afebrile, heart rate 119, respirations and pulse oximetry normal, blood pressure 120/70 -Na 136, K 3.9, Cr 1.04, WBC 14.9K, Hgb 9.5 (baseline unknown) -Transaminases and albumin normal -FOBT positive and rectal exam had dark sticky stool -He was typed and screened, given pantoprazole bolus, octreotide and ceftriaxone -The case was discussed with gastroenterology, who agreed with above and recommended stepdown admission -TRH was asked to evaluate  He has no previous history of GI bleed, no known cirrhosis. He takes no anticoagulants or aspirin, and denies all over-the-counter pain medicines and NSAIDs.       ROS: Review of Systems  Constitutional: Positive for chills (today after vomiting blood). Negative for fever.  Gastrointestinal: Positive for abdominal pain, diarrhea, melena and vomiting (hematemesis once today, large volume). Negative for blood in stool.  Musculoskeletal: Positive for joint pain (and swelling left knee).  All other systems reviewed and are negative.         Past Medical History:  Diagnosis Date  . Hypertension     Past Surgical History:  Procedure  Laterality Date  . Urologic surgery, unknown, for hematuria       Social History: Patient lives With his wife.  The patient walks unassisted. He smokes. Drinks about 80-120 ounces of malt liquor daily, has for "more than 10 years", no personal history of withdrawal hospitalization for withdrawal.   No Known Allergies  Family history: family history includes Alcoholism in his father; Chronic Renal Failure in his mother; Hypertension in his mother.  Prior to Admission medications   Medication Sig Start Date End Date Taking? Authorizing Provider  erythromycin ophthalmic ointment Place a 1/2 inch ribbon of ointment into the lower eyelid. BOTH EYES Patient not taking: Reported on 07/01/2014 01/20/14   Al Corpus, PA-C  ketorolac (ACULAR) 0.5 % ophthalmic solution Place 1 drop into both eyes every 6 (six) hours. Patient not taking: Reported on 07/01/2014 01/20/14   Al Corpus, PA-C  lisinopril (PRINIVIL,ZESTRIL) 20 MG tablet Take 1 tablet (20 mg total) by mouth daily. 05/02/15   Kinnie Feil, MD  oxyCODONE-acetaminophen (PERCOCET) 5-325 MG per tablet Take 1 tablet by mouth every 6 (six) hours as needed for moderate pain. 07/01/14   Pamella Pert, MD  simvastatin (ZOCOR) 20 MG tablet Take 1 tablet (20 mg total) by mouth daily. 05/02/15   Kinnie Feil, MD       Physical Exam: BP 122/78   Pulse 83   Temp 97.6 F (36.4 C) (Oral)   Resp 20   Ht _0  (1.651 m)   Wt 65.8 kg (145 lb)   SpO2 96%   BMI  24.13 kg/m  General appearance: Well-developed, adult male, alert and in no acute distress.   Eyes: Mild icterus?, conjunctiva pink, lids and lashes normal. PERRL.    ENT: No nasal deformity, discharge, epistaxis.  Hearing normal. OP moist without lesions but with some dried blood on the lips.   Neck: No neck masses.  Trachea midline.  No thyromegaly/tenderness. Lymph: No cervical or supraclavicular lymphadenopathy. Skin: Warm and dry.   No suspicious rashes or lesions. Cardiac:  Tachycardic, regular, nl S1-S2, no murmurs appreciated.  Capillary refill is brisk.  JVP not visible.  No LE edema.  Radial and DP pulses 2+ and symmetric. Respiratory: Normal respiratory rate and rhythm.  CTAB without rales or wheezes. Abdomen: Abdomen soft.  Diffuse nonfocal TTP with voluntary. No ascites, distension, unable to palpate liver from guarding.   MSK: No deformities.  The left knee is not red or warm, but has a tender effusion.  He is able to straighten the left knee to 0degrees and lacks only about 30 degrees from full flexion, but has pain with movement or palpation of the knee.  No cyanosis or clubbing. Neuro: Cranial nerves grossly normal.  Sensation intact to light touch. Speech is fluent.  Muscle strength normal.    Psych: Sensorium intact and responding to questions, attention normal.  Behavior appropriate.  Affect noraml.  Judgment and insight appear normal.     Labs on Admission:  I have personally reviewed following labs and imaging studies: CBC:  Recent Labs Lab 12/13/15 1635  WBC 14.9*  NEUTROABS 12.4*  HGB 9.5*  HCT 29.3*  MCV 88.5  PLT 321*   Basic Metabolic Panel:  Recent Labs Lab 12/13/15 1635  NA 136  K 3.9  CL 104  CO2 25  GLUCOSE 123*  BUN 37*  CREATININE 1.04  CALCIUM 8.6*   GFR: Estimated Creatinine Clearance: 70.6 mL/min (by C-G formula based on SCr of 1.04 mg/dL).  Liver Function Tests:  Recent Labs Lab 12/13/15 1635  AST 17  ALT 11*  ALKPHOS 49  BILITOT 0.7  PROT 6.1*  ALBUMIN 3.2*    Recent Labs Lab 12/13/15 1635  LIPASE 25   No results for input(s): AMMONIA in the last 168 hours. Coagulation Profile: No results for input(s): INR, PROTIME in the last 168 hours. Cardiac Enzymes: No results for input(s): CKTOTAL, CKMB, CKMBINDEX, TROPONINI in the last 168 hours. BNP (last 3 results) No results for input(s): PROBNP in the last 8760 hours. HbA1C: No results for input(s): HGBA1C in the last 72 hours. CBG: No results  for input(s): GLUCAP in the last 168 hours. Lipid Profile: No results for input(s): CHOL, HDL, LDLCALC, TRIG, CHOLHDL, LDLDIRECT in the last 72 hours. Thyroid Function Tests: No results for input(s): TSH, T4TOTAL, FREET4, T3FREE, THYROIDAB in the last 72 hours. Anemia Panel: No results for input(s): VITAMINB12, FOLATE, FERRITIN, TIBC, IRON, RETICCTPCT in the last 72 hours. Sepsis Labs: Invalid input(s): PROCALCITONIN, LACTICIDVEN No results found for this or any previous visit (from the past 240 hour(s)).       Radiological Exams on Admission: Personally reviewed the following report: Dg Knee Complete 4 Views Left  Result Date: 12/13/2015 CLINICAL DATA:  Chronic left knee pain. Question of twisting injury to left knee. Assess for knee joint effusion. Initial encounter. EXAM: LEFT KNEE - COMPLETE 4+ VIEW COMPARISON:  None. FINDINGS: There is no evidence of fracture or dislocation. The joint spaces are preserved. No significant degenerative change is seen; the patellofemoral joint is grossly unremarkable in appearance.  A moderate knee joint effusion is noted. The visualized soft tissues are otherwise unremarkable. IMPRESSION: 1. No evidence of fracture or dislocation. 2. Moderate knee joint effusion noted. Electronically Signed   By: Garald Balding M.D.   On: 12/13/2015 22:25        Assessment/Plan 1. GI bleed:  Hematemesis and melena suggest primarily upper source.  No prior history of varices, but with alcohol use history and reported volume of bleeding, cannot rule out varices.  Other considerations include ulcer or gastritis. -Monitor in stepdown overnight -Continue PPI BID -Continue octreotide gtt -Continue ceftriaxone for empiric ppx in ?variceal bleed -Consult to GI, appreciate cares -Check INR -Repeat lactic acid   2. Acute blood loss anemia:  -Repeat Hgb q6hrs overnight -Transfuse for Hgb < 7  3. HTN:  -Hold lisinopril until hemodynamics clearer  4. Alcohol use  disorder:  -CIWA protocol -Thiamine and folate -SW consult for treatment of AUD  5. Knee pain and effusion: No arthritis by x-ray.  Has history of old knee injury, and recent effusion.  Despite his SIRS syndrome, his joint movement good, and my suspicion for septic joint is very low. -Check ESR and CRP and can consider aspiration/Ortho consult if persistently painful, worsening, or markers are significantly elevated -Serial exams -Ice and rest and acetaminophen for now  6. Elevated lactic acid: Patient meets SIRS criteria, but no obvious source of infection.  Blood and urine cultures obtained (blood after ceftriaxone unfortunately).  Ultimately, suspect lactic acidemia from bleeding, posibly liver dysfunction, at this time doubt sepsis syndrome.        DVT prophylaxis: SCDs  Code Status: FULL  Family Communication: Wife at bedside  Disposition Plan: Anticipate octreotide, PPI, and ceftriaxone overnight in Stepdown.  Trend Hgb overnight.  Consult to GI, appreciate cares. Consults called: GI Admission status: OBS, stepdown At the point of initial evaluation, it is my clinical opinion that admission for OBSERVATION is reasonable and necessary because the patient's presenting complaints in the context of their chronic conditions represent sufficient risk of deterioration or significant morbidity to constitute reasonable grounds for close observation in the hospital setting, but that the patient may be medically stable for discharge from the hospital within 24 to 48 hours.    Medical decision making: Patient seen at 8:00 PM on 12/13/2015.  The patient was discussed with Dr. Amedeo Gory.  What exists of the patient's chart was reviewed in depth and summarized above.  Clinical condition: stable.        Edwin Dada [facility] Pager [phone]

## 2015-12-13 NOTE — ED Notes (Signed)
Pt given urinal and made aware of need for urine specimen 

## 2015-12-13 NOTE — ED Triage Notes (Signed)
Pt. Reports having dark tarry stools since Friday and then today he was going to his bathroom and became dizzy and vomited X3 episodes of bright red blood. .  Pt. Also drinks alcohol 3 to 4 40's  A day.  Pt. Denies any drug use.  His last drink was Friday. Pt. Is alert and oriented /x4.  Skin is warm and dry.

## 2015-12-13 NOTE — ED Notes (Signed)
Pt. Just reported that the spots in his eyes are decreasing

## 2015-12-13 NOTE — ED Provider Notes (Signed)
[facility] DEPT Provider Note   CSN: [id number] Arrival date & time: 12/13/15  1550    History   Chief Complaint Chief Complaint  Patient presents with  . Abdominal Pain    HPI Colin Warner is a 55 y.o. male.  The history is provided by the patient, the EMS personnel and medical records.   55 year old male with history of hypertension presenting with abdominal pain, dark tarry stool, and hematemesis. Onset was 2 days ago. Worsening since then. Pain is mild, but diarrhea and emesis now are severe. Described as mild cramping in lower abdomen with loose stools that are black and tarry, no bright red blood, without rectal pain. Associated 1 episode of vomiting that was food material only on Friday when symptoms began, and no vomiting since, until today he had several episodes of bright red bloody emesis. Associated feeling lightheaded and weak all over. Denies any prior history of cirrhosis, known varices, any prior GI bleeds. He has not had a colonoscopy or endoscopy ever. He has used some NSAIDs recently occasionally for pain in his belly, but not every day. Denies anticoagulation. Has not tried taking anything for symptoms. He is a daily ETOH user, about 3-4 40 oz beers daily. Last drink Friday. He denies hx withdrawal shakes, seizures.     Past Medical History:  Diagnosis Date  . Hypertension     Patient Active Problem List   Diagnosis Date Noted  . GI bleed 12/13/2015  . Essential hypertension 12/13/2015  . Acute blood loss anemia 12/13/2015  . Left knee pain 12/13/2015  . Alcohol use disorder, moderate, dependence ([place]) 12/13/2015    Past Surgical History:  Procedure Laterality Date  . Urologic surgery, unknown, for hematuria        Home Medications    Prior to Admission medications   Medication Sig Start Date End Date Taking? Authorizing Provider  aspirin EC 325 MG tablet Take 325 mg by mouth daily as needed (pain).   Yes Historical Provider, MD  ibuprofen  (ADVIL,MOTRIN) 200 MG tablet Take 400 mg by mouth every 6 (six) hours as needed (pain).   Yes Historical Provider, MD  lisinopril (PRINIVIL,ZESTRIL) 20 MG tablet Take 1 tablet (20 mg total) by mouth daily. 05/02/15  Yes Kinnie Feil, MD  simvastatin (ZOCOR) 20 MG tablet Take 1 tablet (20 mg total) by mouth daily. Patient taking differently: Take 20 mg by mouth at bedtime.  05/02/15  Yes Kinnie Feil, MD    Family History Family History  Problem Relation Age of Onset  . Hypertension Mother   . Chronic Renal Failure Mother   . Alcoholism Father     Social History Social History  Substance Use Topics  . Smoking status: Current Every Day Smoker    Packs/day: 0.50    Types: Cigarettes  . Smokeless tobacco: Never Used  . Alcohol use Yes     Comment: 40oz daily, more on weekends, pt. drinks      Allergies   Review of patient's allergies indicates no known allergies.   Review of Systems Review of Systems  Constitutional: Positive for fatigue. Negative for fever.  Respiratory: Negative for cough and shortness of breath.   Cardiovascular: Negative for chest pain.  Gastrointestinal: Positive for abdominal pain, diarrhea, nausea and vomiting.  Genitourinary: Negative for decreased urine volume, dysuria and hematuria.  Musculoskeletal: Positive for arthralgias (chronic left knee pain and swelling that have been present > 1 year).  Skin: Negative for rash.  Allergic/Immunologic: Negative for immunocompromised  state.  Neurological: Positive for light-headedness. Negative for seizures and syncope.  Hematological: Does not bruise/bleed easily.  All other systems reviewed and are negative.    Physical Exam Updated Vital Signs BP 118/82   Pulse 88   Temp 97.6 F (36.4 C) (Oral)   Resp 14   Ht 5\' 5"  (1.651 m)   Wt 65.8 kg   SpO2 99%   BMI 24.13 kg/m   Physical Exam  Constitutional: He appears well-developed and well-nourished.  HENT:  Head: Normocephalic and atraumatic.    Eyes: Conjunctivae are normal. Scleral icterus (slight ) is present.  Neck: Neck supple.  Cardiovascular: Regular rhythm.  Tachycardia present.   No murmur heard. Pulmonary/Chest: Effort normal and breath sounds normal. No respiratory distress.  Abdominal: Soft. He exhibits no mass. There is tenderness (mildly TTP diffusely, no r/g, not well localized ).  Genitourinary:  Genitourinary Comments: Rectal exam with loose dark tarry stool, no fissures or hemorrhoids  Musculoskeletal: He exhibits edema (edema about left knee, full ROM, chronic issue per patient ).  Neurological: He is alert.  Skin: Skin is warm and dry.  Psychiatric: He has a normal mood and affect.  Nursing note and vitals reviewed.    ED Treatments / Results  Labs (all labs ordered are listed, but only abnormal results are displayed) Labs Reviewed  CBC WITH DIFFERENTIAL/PLATELET - Abnormal; Notable for the following:       Result Value   WBC 14.9 (*)    RBC 3.31 (*)    Hemoglobin 9.5 (*)    HCT 29.3 (*)    Platelets 415 (*)    Neutro Abs 12.4 (*)    All other components within normal limits  COMPREHENSIVE METABOLIC PANEL - Abnormal; Notable for the following:    Glucose, Bld 123 (*)    BUN 37 (*)    Calcium 8.6 (*)    Total Protein 6.1 (*)    Albumin 3.2 (*)    ALT 11 (*)    All other components within normal limits  POC OCCULT BLOOD, ED - Abnormal; Notable for the following:    Fecal Occult Bld POSITIVE (*)    All other components within normal limits  I-STAT CG4 LACTIC ACID, ED - Abnormal; Notable for the following:    Lactic Acid, Venous 2.70 (*)    All other components within normal limits  CULTURE, BLOOD (ROUTINE X 2)  CULTURE, BLOOD (ROUTINE X 2)  URINE CULTURE  LIPASE, BLOOD  URINALYSIS, ROUTINE W REFLEX MICROSCOPIC (NOT AT [facility])  PROTIME-INR  TYPE AND SCREEN  ABO/RH    EKG  EKG Interpretation None       Radiology Dg Knee Complete 4 Views Left  Result Date: 12/13/2015 CLINICAL DATA:   Chronic left knee pain. Question of twisting injury to left knee. Assess for knee joint effusion. Initial encounter. EXAM: LEFT KNEE - COMPLETE 4+ VIEW COMPARISON:  None. FINDINGS: There is no evidence of fracture or dislocation. The joint spaces are preserved. No significant degenerative change is seen; the patellofemoral joint is grossly unremarkable in appearance. A moderate knee joint effusion is noted. The visualized soft tissues are otherwise unremarkable. IMPRESSION: 1. No evidence of fracture or dislocation. 2. Moderate knee joint effusion noted. Electronically Signed   By: Garald Balding M.D.   On: 12/13/2015 22:25    Procedures Procedures (including critical care time)  Medications Ordered in ED Medications  octreotide (SANDOSTATIN) 2 mcg/mL load via infusion 50 mcg (50 mcg Intravenous Bolus from Bag 12/13/15  1753)    And  octreotide (SANDOSTATIN) 500 mcg in sodium chloride 0.9 % 250 mL (2 mcg/mL) infusion (50 mcg/hr Intravenous New Bag/Given 12/13/15 1755)  pantoprazole (PROTONIX) injection 40 mg (40 mg Intravenous Given 12/13/15 2126)  sodium chloride 0.9 % bolus 1,000 mL (0 mLs Intravenous Stopped 12/13/15 1743)  ondansetron (ZOFRAN) injection 4 mg (4 mg Intravenous Given 12/13/15 1646)  sodium chloride 0.9 % bolus 1,000 mL (0 mLs Intravenous Stopped 12/13/15 1920)  cefTRIAXone (ROCEPHIN) 1 g in dextrose 5 % 50 mL IVPB (0 g Intravenous Stopped 12/13/15 2133)  pantoprazole (PROTONIX) 80 mg in sodium chloride 0.9 % 100 mL IVPB (80 mg Intravenous New Bag/Given 12/13/15 2129)     Initial Impression / Assessment and Plan / ED Course  I have reviewed the triage vital signs and the nursing notes.  Pertinent labs & imaging results that were available during my care of the patient were reviewed by me and considered in my medical decision making (see chart for details).  Clinical Course   55 year old male with history hypertension, EtOH abuse presenting with mild abdominal pain and GI  bleeding, described as dark tarry stools for 3 days as well as frank bright red hematemesis today. Rectal exam with dark tarry stool that is occult blood positive. On arrival he is afebrile, stable blood pressure but tachycardic to 120s. Antiemetics given, no recurrence of hematemesis while in ED. Hemoglobin is 9.5, platelets elevated, leukocytosis to 14.9 with lactic acidosis. Likely upper GI bleed, with differential including ulcer disease, gastritis, or possibly undiagnosed variceal disease given extensive EtOH history. However he has no known diagnosis of cirrhosis at this time. He has no prior endoscopy or colonoscopy in the past and no prior episodes of GI bleeding. Octreotide, PPI started, and patient given Rocephin. Discussed case with GI on call who will follow and likely plan for endoscopy tomorrow. Admitted to hospitalist service, stepdown unit for further care.  Case discussed with Dr. Sabra Heck who oversaw management of this patient.    Final Clinical Impressions(s) / ED Diagnoses   Final diagnoses:  Hematemesis with nausea  Acute upper GI bleed    New Prescriptions New Prescriptions   No medications on file     Ivin Booty, MD 12/13/15 2249    Noemi Chapel, MD 12/16/15 1004

## 2015-12-13 NOTE — ED Notes (Signed)
Pt. Is having dark spots in front of his eyes.  Pt. Denies any headaches.

## 2015-12-13 NOTE — ED Notes (Signed)
Admitting at bedside 

## 2015-12-13 NOTE — Plan of Care (Signed)
Problem: Bowel/Gastric: Goal: Will show no signs and symptoms of gastrointestinal bleeding Outcome: Progressing Discussed with patient to inform us of any signs and symptoms related to his diagnosis with teach back displayed.

## 2015-12-13 NOTE — ED Notes (Signed)
Pt. Is aware of needing an urine specimen 

## 2015-12-14 ENCOUNTER — Observation Stay (HOSPITAL_COMMUNITY): Payer: Self-pay | Admitting: Anesthesiology

## 2015-12-14 ENCOUNTER — Encounter (HOSPITAL_COMMUNITY): Payer: Self-pay | Admitting: *Deleted

## 2015-12-14 ENCOUNTER — Encounter (HOSPITAL_COMMUNITY)
Admission: EM | Disposition: A | Discharge status: Home / Self Care | Payer: Self-pay | Source: Home / Self Care | Attending: Internal Medicine

## 2015-12-14 DIAGNOSIS — K922 Gastrointestinal hemorrhage, unspecified: Secondary | ICD-10-CM

## 2015-12-14 HISTORY — PX: ESOPHAGOGASTRODUODENOSCOPY: SHX5428

## 2015-12-14 LAB — BLOOD CULTURE ID PANEL (REFLEXED)
ACINETOBACTER BAUMANNII: NOT DETECTED
CANDIDA ALBICANS: NOT DETECTED
CANDIDA GLABRATA: NOT DETECTED
CANDIDA PARAPSILOSIS: NOT DETECTED
Candida krusei: NOT DETECTED
Candida tropicalis: NOT DETECTED
ENTEROBACTER CLOACAE COMPLEX: NOT DETECTED
ENTEROBACTERIACEAE SPECIES: NOT DETECTED
ESCHERICHIA COLI: NOT DETECTED
Enterococcus species: NOT DETECTED
HAEMOPHILUS INFLUENZAE: NOT DETECTED
KLEBSIELLA OXYTOCA: NOT DETECTED
KLEBSIELLA PNEUMONIAE: NOT DETECTED
Listeria monocytogenes: NOT DETECTED
METHICILLIN RESISTANCE: DETECTED — AB
Neisseria meningitidis: NOT DETECTED
PSEUDOMONAS AERUGINOSA: NOT DETECTED
Proteus species: NOT DETECTED
STAPHYLOCOCCUS AUREUS BCID: NOT DETECTED
STREPTOCOCCUS PNEUMONIAE: NOT DETECTED
Serratia marcescens: NOT DETECTED
Staphylococcus species: DETECTED — AB
Streptococcus agalactiae: NOT DETECTED
Streptococcus pyogenes: NOT DETECTED
Streptococcus species: NOT DETECTED

## 2015-12-14 LAB — COMPREHENSIVE METABOLIC PANEL
ALT: 8 U/L — AB (ref 17–63)
AST: 13 U/L — ABNORMAL LOW (ref 15–41)
Albumin: 2.6 g/dL — ABNORMAL LOW (ref 3.5–5.0)
Alkaline Phosphatase: 39 U/L (ref 38–126)
Anion gap: 5 (ref 5–15)
BUN: 24 mg/dL — ABNORMAL HIGH (ref 6–20)
CHLORIDE: 110 mmol/L (ref 101–111)
CO2: 23 mmol/L (ref 22–32)
CREATININE: 1.02 mg/dL (ref 0.61–1.24)
Calcium: 7.6 mg/dL — ABNORMAL LOW (ref 8.9–10.3)
GFR calc non Af Amer: >60 mL/min (ref >60)
Glucose, Bld: 158 mg/dL — ABNORMAL HIGH (ref 65–99)
POTASSIUM: 4 mmol/L (ref 3.5–5.1)
SODIUM: 138 mmol/L (ref 135–145)
Total Bilirubin: 0.3 mg/dL (ref 0.3–1.2)
Total Protein: 4.8 g/dL — ABNORMAL LOW (ref 6.5–8.1)

## 2015-12-14 LAB — PREPARE RBC (CROSSMATCH)

## 2015-12-14 LAB — LACTIC ACID, PLASMA: LACTIC ACID, VENOUS: 1.7 mmol/L (ref 0.5–1.9)

## 2015-12-14 LAB — CBC
HCT: 20 % — ABNORMAL LOW (ref 39.0–52.0)
HEMATOCRIT: 22.7 % — AB (ref 39.0–52.0)
Hemoglobin: 6.7 g/dL — CL (ref 13.0–17.0)
Hemoglobin: 7.5 g/dL — ABNORMAL LOW (ref 13.0–17.0)
MCH: 29.3 pg (ref 26.0–34.0)
MCH: 29.4 pg (ref 26.0–34.0)
MCHC: 33.5 g/dL (ref 30.0–36.0)
MCHC: 33 g/dL (ref 30.0–36.0)
MCV: 87.3 fL (ref 78.0–100.0)
MCV: 89 fL (ref 78.0–100.0)
PLATELETS: 354 10*3/uL (ref 150–400)
Platelets: 320 10*3/uL (ref 150–400)
RBC: 2.29 MIL/uL — AB (ref 4.22–5.81)
RBC: 2.55 MIL/uL — ABNORMAL LOW (ref 4.22–5.81)
RDW: 13.7 % (ref 11.5–15.5)
RDW: 14.1 % (ref 11.5–15.5)
WBC: 11.2 10*3/uL — ABNORMAL HIGH (ref 4.0–10.5)
WBC: 6.8 10*3/uL (ref 4.0–10.5)

## 2015-12-14 LAB — SEDIMENTATION RATE: Sed Rate: 6 mm/hr (ref 0–16)

## 2015-12-14 LAB — HEMOGLOBIN AND HEMATOCRIT, BLOOD
HCT: 28.3 % — ABNORMAL LOW (ref 39.0–52.0)
Hemoglobin: 9.2 g/dL — ABNORMAL LOW (ref 13.0–17.0)

## 2015-12-14 LAB — PROTIME-INR
INR: 1.18
Prothrombin Time: 15.1 seconds (ref 11.4–15.2)

## 2015-12-14 LAB — C-REACTIVE PROTEIN

## 2015-12-14 LAB — MRSA PCR SCREENING: MRSA BY PCR: NEGATIVE

## 2015-12-14 SURGERY — EGD (ESOPHAGOGASTRODUODENOSCOPY)
Anesthesia: Monitor Anesthesia Care

## 2015-12-14 MED ORDER — SODIUM CHLORIDE 0.9 % IV SOLN: INTRAVENOUS | Status: DC

## 2015-12-14 MED ORDER — LIDOCAINE 2% (20 MG/ML) 5 ML SYRINGE
INTRAMUSCULAR | Status: DC | PRN
  Administered 2015-12-14: 60 mg via INTRAVENOUS

## 2015-12-14 MED ORDER — PROPOFOL 500 MG/50ML IV EMUL
INTRAVENOUS | Status: DC | PRN
  Administered 2015-12-14: 150 ug/kg/min via INTRAVENOUS

## 2015-12-14 MED ORDER — DIPHENHYDRAMINE HCL 25 MG PO CAPS
25.0000 mg | ORAL_CAPSULE | Freq: Once | ORAL | Status: AC
  Administered 2015-12-14: 25 mg via ORAL
  Filled 2015-12-14: qty 1

## 2015-12-14 MED ORDER — SODIUM CHLORIDE 0.9 % IV SOLN: Freq: Once | INTRAVENOUS | Status: DC

## 2015-12-14 MED ORDER — ACETAMINOPHEN 325 MG PO TABS
650.0000 mg | ORAL_TABLET | Freq: Once | ORAL | Status: AC
  Administered 2015-12-14: 650 mg via ORAL
  Filled 2015-12-14: qty 2

## 2015-12-14 MED ORDER — SODIUM CHLORIDE 0.9 % IV SOLN
Freq: Once | INTRAVENOUS | Status: AC
  Administered 2015-12-14: 06:00:00 via INTRAVENOUS

## 2015-12-14 MED ORDER — NICOTINE 14 MG/24HR TD PT24
14.0000 mg | MEDICATED_PATCH | Freq: Every day | TRANSDERMAL | Status: DC
  Administered 2015-12-14 – 2015-12-15 (×2): 14 mg via TRANSDERMAL
  Filled 2015-12-14 (×2): qty 1

## 2015-12-14 MED ORDER — LACTATED RINGERS IV SOLN
INTRAVENOUS | Status: DC
  Administered 2015-12-14: 12:00:00 via INTRAVENOUS

## 2015-12-14 NOTE — Anesthesia Procedure Notes (Signed)
Procedure Name: MAC Date/Time: 12/14/2015 12:36 PM Performed by: Kyung Rudd Pre-anesthesia Checklist: Patient identified, Emergency Drugs available, Suction available and Patient being monitored Patient Re-evaluated:Patient Re-evaluated prior to inductionOxygen Delivery Method: Nasal cannula Intubation Type: IV induction Placement Confirmation: positive ETCO2

## 2015-12-14 NOTE — Transfer of Care (Signed)
Immediate Anesthesia Transfer of Care Note  Patient: Colin Warner  Procedure(s) Performed: Procedure(s): ESOPHAGOGASTRODUODENOSCOPY (EGD) (N/A)  Patient Location: Endoscopy Unit  Anesthesia Type:MAC  Level of Consciousness: awake, alert  and oriented  Airway & Oxygen Therapy: Patient Spontanous Breathing and Patient connected to nasal cannula oxygen  Post-op Assessment: Report given to RN, Post -op Vital signs reviewed and stable and Patient moving all extremities X 4  Post vital signs: Reviewed and stable  Last Vitals:  Vitals:   12/14/15 1144 12/14/15 1300  BP: (!) 127/94 130/86  Pulse: 76 92  Resp: 14 (!) 21  Temp: 36.4 C 36.3 C    Last Pain:  Vitals:   12/14/15 1300  TempSrc: Oral  PainSc:          Complications: No apparent anesthesia complications

## 2015-12-14 NOTE — Anesthesia Preprocedure Evaluation (Addendum)
Anesthesia Evaluation  Patient identified by MRN, date of birth, ID band Patient awake    Reviewed: Allergy & Precautions, NPO status , Patient's Chart, lab work & pertinent test results  Airway Mallampati: II  TM Distance: >3 FB Neck ROM: Full  Mouth opening: Limited Mouth Opening  Dental  (+) Teeth Intact, Poor Dentition, Dental Advisory Given   Pulmonary Current Smoker,    breath sounds clear to auscultation       Cardiovascular hypertension, Pt. on medications  Rhythm:Regular Rate:Normal     Neuro/Psych    GI/Hepatic   Endo/Other    Renal/GU      Musculoskeletal   Abdominal   Peds  Hematology  (+) anemia ,   Anesthesia Other Findings   Reproductive/Obstetrics                             Anesthesia Physical Anesthesia Plan  ASA: III  Anesthesia Plan: MAC   Post-op Pain Management:    Induction: Intravenous  Airway Management Planned: Simple Face Mask  Additional Equipment:   Intra-op Plan:   Post-operative Plan:   Informed Consent: I have reviewed the patients History and Physical, chart, labs and discussed the procedure including the risks, benefits and alternatives for the proposed anesthesia with the patient or authorized representative who has indicated his/her understanding and acceptance.   Dental advisory given  Plan Discussed with: CRNA, Anesthesiologist and Surgeon  Anesthesia Plan Comments:         Anesthesia Quick Evaluation

## 2015-12-14 NOTE — Progress Notes (Signed)
Procedures CRITICAL VALUE ALERT  Critical value received:  Hgb 6.7  Date of notification:  12/14/15  Time of notification:  [id number]  Critical value read back: yes  Nurse who received alert:  Candiss Norse RN  MD notified (1st page):  Dr. Georges Mouse  Time of first page:  [phone]  MD notified (2nd page):  Time of second page:  Responding MD:  Dr. Georges Mouse  Time MD responded:  [phone]

## 2015-12-14 NOTE — Progress Notes (Signed)
PROGRESS NOTE  Colin Warner EOF:[id number] DOB: October 22, 1960 DOA: 12/13/2015 PCP: No PCP Per Patient  Brief History:  55 year old male with a history of alcohol dependence, hypertension presented with one-day history of hematemesis and dizziness. On 12/11/2015, the patient noted some melanotic stools. Over the weekend, the patient continued to have some abdominal discomfort. He has some intermittent nausea, but developed hematemesis on 12/13/2015. He continues to drink 2-3 x 40 oz beers daily for the past 10 years. He denies any other illegal drug use. Upon presentation, the patient was noted to have R1 19, BP 120/70. Due to concerns of chronic liver disease with possible variceal bleed, the patient was started on octreotide, ceftriaxone, and Protonix. GI was consulted to assist with management.  The patient presented with a hemoglobin of 9.5. Unfortunately, his hemoglobin continued to drop and required transfusion 2 units PRBC.  Assessment/Plan: Hematemesis/upper GI bleed -Appreciate GI consult -Continue PPI twice a day -12/14/2015 EGD--erosive due duodenopathy -Discontinue octreotide -Advance diet to full liquids -Discontinue antibiotics -INR 1.18  Acute blood loss anemia -Secondary to hematemesis -Transfuse 2 units PRBC -Remained hemodynamically stable  Lactic acidosis -Secondary to volume depletion -Transfused PRBC as discussed -IV boluses given  Left knee pain/ left knee effusion -No objective evidence for septic joint -ESR 6, CRP <0.5 -Patient gives history of possible recent twisting injury -X-ray negative for bony abnormalities -Conservative care  HTN -stable with hold lisinopril   Alcohol dependence -CIWA  Tobacco Abuse -nicoderm patch -cessation discussed  Disposition Plan:   Home 9/19 or 9/20 if stable Family Communication:  No Family at bedside  Consultants:  [facility]  Code Status:  FULL  DVT Prophylaxis:  SCDs   Procedures: As Listed in  Progress Note Above  Antibiotics: None    Subjective: Patient denies fevers, chills, headache, chest pain, dyspnea, nausea, vomiting, diarrhea, abdominal pain, dysuria, hematuria, hematochezia, and melena.   Objective: Vitals:   12/14/15 1134 12/14/15 1144 12/14/15 1300 12/14/15 1310  BP: (!) 122/93 (!) 127/94 130/86 (!) 126/94  Pulse:  76 92 94  Resp:  14 (!) 21 (!) 24  Temp: 97.6 F (36.4 C) 97.6 F (36.4 C) 97.3 F (36.3 C)   TempSrc: Oral Oral Oral   SpO2:  100% 100% 100%  Weight:  59.7 kg (131 lb 11.2 oz)    Height:  '5\' 5"'  (1.651 m)      Intake/Output Summary (Last 24 hours) at 12/14/15 1707 Last data filed at 12/14/15 1506  Gross per 24 hour  Intake          4678.33 ml  Output              975 ml  Net          3703.33 ml   Weight change:  Exam:   General:  Pt is alert, follows commands appropriately, not in acute distress  HEENT: No icterus, No thrush, No neck mass, [place]/AT  Cardiovascular: RRR, S1/S2, no rubs, no gallops  Respiratory: Diminished breath sounds without any wheezing at the bases. No rhonchi   Abdomen: Soft/+BS, non tender, non distended, no guarding  Extremities: No edema, No lymphangitis, No petechiae, No rashes, no synovitis   Data Reviewed: I have personally reviewed following labs and imaging studies Basic Metabolic Panel:  Recent Labs Lab 12/13/15 1635 12/14/15 0329  NA 136 138  K 3.9 4.0  CL 104 110  CO2 25 23  GLUCOSE 123* 158*  BUN 37*  24*  CREATININE 1.04 1.02  CALCIUM 8.6* 7.6*   Liver Function Tests:  Recent Labs Lab 12/13/15 1635 12/14/15 0329  AST 17 13*  ALT 11* 8*  ALKPHOS 49 39  BILITOT 0.7 0.3  PROT 6.1* 4.8*  ALBUMIN 3.2* 2.6*    Recent Labs Lab 12/13/15 1635  LIPASE 25   No results for input(s): AMMONIA in the last 168 hours. Coagulation Profile:  Recent Labs Lab 12/13/15 2349  INR 1.18   CBC:  Recent Labs Lab 12/13/15 1635 12/13/15 2349 12/14/15 0329 12/14/15 1555  WBC 14.9*  11.2* 6.8  --   NEUTROABS 12.4*  --   --   --   HGB 9.5* 7.5* 6.7* 9.2*  HCT 29.3* 22.7* 20.0* 28.3*  MCV 88.5 89.0 87.3  --   PLT 415* 320 354  --    Cardiac Enzymes: No results for input(s): CKTOTAL, CKMB, CKMBINDEX, TROPONINI in the last 168 hours. BNP: Invalid input(s): POCBNP CBG: No results for input(s): GLUCAP in the last 168 hours. HbA1C: No results for input(s): HGBA1C in the last 72 hours. Urine analysis:    Component Value Date/Time   COLORURINE YELLOW 12/13/2015 [place] 12/13/2015 1604   LABSPEC 1.021 12/13/2015 1604   PHURINE 6.0 12/13/2015 1604   GLUCOSEU NEGATIVE 12/13/2015 1604   HGBUR NEGATIVE 12/13/2015 1604   BILIRUBINUR NEGATIVE 12/13/2015 1604   KETONESUR NEGATIVE 12/13/2015 1604   PROTEINUR NEGATIVE 12/13/2015 1604   NITRITE NEGATIVE 12/13/2015 1604   LEUKOCYTESUR NEGATIVE 12/13/2015 1604   Sepsis Labs: '@LABRCNTIP' (procalcitonin:4,lacticidven:4) ) Recent Results (from the past 240 hour(s))  Culture, blood (routine x 2)     Status: None (Preliminary result)   Collection Time: 12/13/15  9:24 PM  Result Value Ref Range Status   Specimen Description BLOOD BLOOD RIGHT FOREARM  Final   Special Requests IN PEDIATRIC BOTTLE 1CC  Final   Culture NO GROWTH < 24 HOURS  Final   Report Status PENDING  Incomplete  Culture, blood (routine x 2)     Status: None (Preliminary result)   Collection Time: 12/13/15  9:47 PM  Result Value Ref Range Status   Specimen Description BLOOD RIGHT HAND  Final   Special Requests BOTTLES DRAWN AEROBIC ONLY 5CC  Final   Culture NO GROWTH < 24 HOURS  Final   Report Status PENDING  Incomplete  MRSA PCR Screening     Status: None   Collection Time: 12/13/15 10:57 PM  Result Value Ref Range Status   MRSA by PCR NEGATIVE NEGATIVE Final    Comment:        The GeneXpert MRSA Assay (FDA approved for NASAL specimens only), is one component of a comprehensive MRSA colonization surveillance program. It is  not intended to diagnose MRSA infection nor to guide or monitor treatment for MRSA infections.      Scheduled Meds: . sodium chloride   Intravenous Once  . folic acid  1 mg Oral Daily  . multivitamin with minerals  1 tablet Oral Daily  . nicotine  14 mg Transdermal Daily  . pantoprazole (PROTONIX) IV  40 mg Intravenous Q12H  . thiamine  100 mg Oral Daily   Or  . thiamine  100 mg Intravenous Daily   Continuous Infusions:   Procedures/Studies: Dg Knee Complete 4 Views Left  Result Date: 12/13/2015 CLINICAL DATA:  Chronic left knee pain. Question of twisting injury to left knee. Assess for knee joint effusion. Initial encounter. EXAM: LEFT KNEE - COMPLETE 4+ VIEW COMPARISON:  None. FINDINGS: There is no evidence of fracture or dislocation. The joint spaces are preserved. No significant degenerative change is seen; the patellofemoral joint is grossly unremarkable in appearance. A moderate knee joint effusion is noted. The visualized soft tissues are otherwise unremarkable. IMPRESSION: 1. No evidence of fracture or dislocation. 2. Moderate knee joint effusion noted. Electronically Signed   By: Garald Balding M.D.   On: 12/13/2015 22:25    Vern Guerette, DO  [facility] Pager [phone]  If 7PM-7AM, please contact night-coverage [email or web address] Password [email or web address] 12/14/2015, 5:07 PM   LOS: 0 days

## 2015-12-14 NOTE — Progress Notes (Signed)
PHARMACY - PHYSICIAN COMMUNICATION CRITICAL VALUE ALERT - BLOOD CULTURE IDENTIFICATION (BCID)  Results for orders placed or performed during the hospital encounter of 12/13/15  Blood Culture ID Panel (Reflexed) (Collected: 12/13/2015  9:24 PM)  Result Value Ref Range   Enterococcus species NOT DETECTED NOT DETECTED   Listeria monocytogenes NOT DETECTED NOT DETECTED   Staphylococcus species DETECTED (A) NOT DETECTED   Staphylococcus aureus NOT DETECTED NOT DETECTED   Methicillin resistance DETECTED (A) NOT DETECTED   Streptococcus species NOT DETECTED NOT DETECTED   Streptococcus agalactiae NOT DETECTED NOT DETECTED   Streptococcus pneumoniae NOT DETECTED NOT DETECTED   Streptococcus pyogenes NOT DETECTED NOT DETECTED   Acinetobacter baumannii NOT DETECTED NOT DETECTED   Enterobacteriaceae species NOT DETECTED NOT DETECTED   Enterobacter cloacae complex NOT DETECTED NOT DETECTED   Escherichia coli NOT DETECTED NOT DETECTED   Klebsiella oxytoca NOT DETECTED NOT DETECTED   Klebsiella pneumoniae NOT DETECTED NOT DETECTED   Proteus species NOT DETECTED NOT DETECTED   Serratia marcescens NOT DETECTED NOT DETECTED   Haemophilus influenzae NOT DETECTED NOT DETECTED   Neisseria meningitidis NOT DETECTED NOT DETECTED   Pseudomonas aeruginosa NOT DETECTED NOT DETECTED   Candida albicans NOT DETECTED NOT DETECTED   Candida glabrata NOT DETECTED NOT DETECTED   Candida krusei NOT DETECTED NOT DETECTED   Candida parapsilosis NOT DETECTED NOT DETECTED   Candida tropicalis NOT DETECTED NOT DETECTED    Name of physician (or Provider) Contacted: text paged Tylene Fantasia  Changes to prescribed antibiotics required: Likely contaminant in 1 of 2 blood cultures - no change needed at this time  Pati Gallo 12/14/2015  9:18 PM

## 2015-12-14 NOTE — Consult Note (Signed)
[facility] Consult Note  Referring Provider: No ref. provider found Primary Care Physician:  No PCP Per Patient Primary Gastroenterologist:  Dr.  Laurel Dimmer Complaint: Hematemesis HPI: Colin Warner is an 55 y.o. black male  who presents with melena followed by hematemesis last night before coming to the emergency room. Hemoglobin 9.5 which has dropped to 6.7. He had an elevated BUN and normal liver function tests and platelet count. He admits to drinking 2 or 340 ounce malt liquors a day for at least 10 years. He appears hemodynamic with stable this morning but is extremely lethargic and I cannot arouse him to get much of a history.  Past Medical History:  Diagnosis Date  . Hypertension     Past Surgical History:  Procedure Laterality Date  . Urologic surgery, unknown, for hematuria       Medications Prior to Admission  Medication Sig Dispense Refill  . aspirin EC 325 MG tablet Take 325 mg by mouth daily as needed (pain).    Marland Kitchen ibuprofen (ADVIL,MOTRIN) 200 MG tablet Take 400 mg by mouth every 6 (six) hours as needed (pain).    Marland Kitchen lisinopril (PRINIVIL,ZESTRIL) 20 MG tablet Take 1 tablet (20 mg total) by mouth daily. 90 tablet 0  . simvastatin (ZOCOR) 20 MG tablet Take 1 tablet (20 mg total) by mouth daily. (Patient taking differently: Take 20 mg by mouth at bedtime. ) 90 tablet 0    Allergies: No Known Allergies  Family History  Problem Relation Age of Onset  . Hypertension Mother   . Chronic Renal Failure Mother   . Alcoholism Father     Social History:  reports that he has been smoking Cigarettes.  He has been smoking about 0.50 packs per day. He has never used smokeless tobacco. He reports that he drinks alcohol. He reports that he does not use drugs.  Review of Systems: negative except Barely obtainable Blood pressure 128/89, pulse 79, temperature 97.6 F (36.4 C), temperature source Oral, resp. rate 14, height 5' 5" (1.651 m), weight 59.7 kg (131 lb 11.2 oz),  SpO2 99 %. Asleep, Verrill lethargic, barely arousable Head: Normocephalic, without obvious abnormality, atraumatic Neck: no adenopathy, no carotid bruit, no JVD, supple, symmetrical, trachea midline and thyroid not enlarged, symmetric, no tenderness/mass/nodules Resp: clear to auscultation bilaterally Cardio: regular rate and rhythm, S1, S2 normal, no murmur, click, rub or gallop GI: Abdomen soft, apparently nontender Extremities: extremities normal, atraumatic, no cyanosis or edema  Results for orders placed or performed during the hospital encounter of 12/13/15 (from the past 48 hour(s))  Urinalysis, Routine w reflex microscopic (not at [facility])     Status: None   Collection Time: 12/13/15  4:04 PM  Result Value Ref Range   Color, Urine YELLOW YELLOW   APPearance CLEAR CLEAR   Specific Gravity, Urine 1.021 1.005 - 1.030   pH 6.0 5.0 - 8.0   Glucose, UA NEGATIVE NEGATIVE mg/dL   Hgb urine dipstick NEGATIVE NEGATIVE   Bilirubin Urine NEGATIVE NEGATIVE   Ketones, ur NEGATIVE NEGATIVE mg/dL   Protein, ur NEGATIVE NEGATIVE mg/dL   Nitrite NEGATIVE NEGATIVE   Leukocytes, UA NEGATIVE NEGATIVE    Comment: MICROSCOPIC NOT DONE ON URINES WITH NEGATIVE PROTEIN, BLOOD, LEUKOCYTES, NITRITE, OR GLUCOSE <1000 mg/dL.  POC occult blood, ED Provider will collect     Status: Abnormal   Collection Time: 12/13/15  4:26 PM  Result Value Ref Range   Fecal Occult Bld POSITIVE (A) NEGATIVE  CBC with Differential  Status: Abnormal   Collection Time: 12/13/15  4:35 PM  Result Value Ref Range   WBC 14.9 (H) 4.0 - 10.5 K/uL   RBC 3.31 (L) 4.22 - 5.81 MIL/uL   Hemoglobin 9.5 (L) 13.0 - 17.0 g/dL   HCT 29.3 (L) 39.0 - 52.0 %   MCV 88.5 78.0 - 100.0 fL   MCH 28.7 26.0 - 34.0 pg   MCHC 32.4 30.0 - 36.0 g/dL   RDW 13.7 11.5 - 15.5 %   Platelets 415 (H) 150 - 400 K/uL   Neutrophils Relative % 84 %   Neutro Abs 12.4 (H) 1.7 - 7.7 K/uL   Lymphocytes Relative 12 %   Lymphs Abs 1.8 0.7 - 4.0 K/uL   Monocytes  Relative 4 %   Monocytes Absolute 0.6 0.1 - 1.0 K/uL   Eosinophils Relative 0 %   Eosinophils Absolute 0.0 0.0 - 0.7 K/uL   Basophils Relative 0 %   Basophils Absolute 0.0 0.0 - 0.1 K/uL  Comprehensive metabolic panel     Status: Abnormal   Collection Time: 12/13/15  4:35 PM  Result Value Ref Range   Sodium 136 135 - 145 mmol/L   Potassium 3.9 3.5 - 5.1 mmol/L   Chloride 104 101 - 111 mmol/L   CO2 25 22 - 32 mmol/L   Glucose, Bld 123 (H) 65 - 99 mg/dL   BUN 37 (H) 6 - 20 mg/dL   Creatinine, Ser 1.04 0.61 - 1.24 mg/dL   Calcium 8.6 (L) 8.9 - 10.3 mg/dL   Total Protein 6.1 (L) 6.5 - 8.1 g/dL   Albumin 3.2 (L) 3.5 - 5.0 g/dL   AST 17 15 - 41 U/L   ALT 11 (L) 17 - 63 U/L   Alkaline Phosphatase 49 38 - 126 U/L   Total Bilirubin 0.7 0.3 - 1.2 mg/dL   GFR calc non Af Amer >60 >60 mL/min   GFR calc Af Amer >60 >60 mL/min    Comment: (NOTE) The eGFR has been calculated using the CKD EPI equation. This calculation has not been validated in all clinical situations. eGFR's persistently <60 mL/min signify possible Chronic Kidney Disease.    Anion gap 7 5 - 15  Type and screen [place]     Status: None (Preliminary result)   Collection Time: 12/13/15  4:35 PM  Result Value Ref Range   ABO/RH(D) A POS    Antibody Screen NEG    Sample Expiration 12/16/2015    Unit Number [id number]    Blood Component Type RED CELLS,LR    Unit division 00    Status of Unit ISSUED    Transfusion Status OK TO TRANSFUSE    Crossmatch Result Compatible   Lipase, blood     Status: None   Collection Time: 12/13/15  4:35 PM  Result Value Ref Range   Lipase 25 11 - 51 U/L  ABO/Rh     Status: None   Collection Time: 12/13/15  4:35 PM  Result Value Ref Range   ABO/RH(D) A POS   I-Stat CG4 Lactic Acid, ED     Status: Abnormal   Collection Time: 12/13/15  4:58 PM  Result Value Ref Range   Lactic Acid, Venous 2.70 (HH) 0.5 - 1.9 mmol/L   Comment NOTIFIED PHYSICIAN   MRSA PCR  Screening     Status: None   Collection Time: 12/13/15 10:57 PM  Result Value Ref Range   MRSA by PCR NEGATIVE NEGATIVE    Comment:  The GeneXpert MRSA Assay (FDA approved for NASAL specimens only), is one component of a comprehensive MRSA colonization surveillance program. It is not intended to diagnose MRSA infection nor to guide or monitor treatment for MRSA infections.   Protime-INR     Status: None   Collection Time: 12/13/15 11:49 PM  Result Value Ref Range   Prothrombin Time 15.1 11.4 - 15.2 seconds   INR 1.18   Lactic acid, plasma     Status: None   Collection Time: 12/13/15 11:49 PM  Result Value Ref Range   Lactic Acid, Venous 1.7 0.5 - 1.9 mmol/L  CBC     Status: Abnormal   Collection Time: 12/13/15 11:49 PM  Result Value Ref Range   WBC 11.2 (H) 4.0 - 10.5 K/uL   RBC 2.55 (L) 4.22 - 5.81 MIL/uL   Hemoglobin 7.5 (L) 13.0 - 17.0 g/dL    Comment: DELTA CHECK NOTED REPEATED TO VERIFY    HCT 22.7 (L) 39.0 - 52.0 %   MCV 89.0 78.0 - 100.0 fL   MCH 29.4 26.0 - 34.0 pg   MCHC 33.0 30.0 - 36.0 g/dL   RDW 13.7 11.5 - 15.5 %   Platelets 320 150 - 400 K/uL  Sedimentation rate     Status: None   Collection Time: 12/13/15 11:49 PM  Result Value Ref Range   Sed Rate 6 0 - 16 mm/hr  C-reactive protein     Status: None   Collection Time: 12/13/15 11:49 PM  Result Value Ref Range   CRP <0.5 <1.0 mg/dL  CBC     Status: Abnormal   Collection Time: 12/14/15  3:29 AM  Result Value Ref Range   WBC 6.8 4.0 - 10.5 K/uL   RBC 2.29 (L) 4.22 - 5.81 MIL/uL   Hemoglobin 6.7 (LL) 13.0 - 17.0 g/dL    Comment: REPEATED TO VERIFY CRITICAL RESULT CALLED TO, READ BACK BY AND VERIFIED WITH: T.NEILSON,RN 0414 12/14/15 M.CAMPBELL    HCT 20.0 (L) 39.0 - 52.0 %   MCV 87.3 78.0 - 100.0 fL   MCH 29.3 26.0 - 34.0 pg   MCHC 33.5 30.0 - 36.0 g/dL   RDW 14.1 11.5 - 15.5 %   Platelets 354 150 - 400 K/uL  Comprehensive metabolic panel     Status: Abnormal   Collection Time: 12/14/15   3:29 AM  Result Value Ref Range   Sodium 138 135 - 145 mmol/L   Potassium 4.0 3.5 - 5.1 mmol/L   Chloride 110 101 - 111 mmol/L   CO2 23 22 - 32 mmol/L   Glucose, Bld 158 (H) 65 - 99 mg/dL   BUN 24 (H) 6 - 20 mg/dL   Creatinine, Ser 1.02 0.61 - 1.24 mg/dL   Calcium 7.6 (L) 8.9 - 10.3 mg/dL   Total Protein 4.8 (L) 6.5 - 8.1 g/dL   Albumin 2.6 (L) 3.5 - 5.0 g/dL   AST 13 (L) 15 - 41 U/L   ALT 8 (L) 17 - 63 U/L   Alkaline Phosphatase 39 38 - 126 U/L   Total Bilirubin 0.3 0.3 - 1.2 mg/dL   GFR calc non Af Amer >60 >60 mL/min   GFR calc Af Amer >60 >60 mL/min    Comment: (NOTE) The eGFR has been calculated using the CKD EPI equation. This calculation has not been validated in all clinical situations. eGFR's persistently <60 mL/min signify possible Chronic Kidney Disease.    Anion gap 5 5 - 15  Prepare RBC  Status: None   Collection Time: 12/14/15  5:00 AM  Result Value Ref Range   Order Confirmation ORDER PROCESSED BY BLOOD BANK    Dg Knee Complete 4 Views Left  Result Date: 12/13/2015 CLINICAL DATA:  Chronic left knee pain. Question of twisting injury to left knee. Assess for knee joint effusion. Initial encounter. EXAM: LEFT KNEE - COMPLETE 4+ VIEW COMPARISON:  None. FINDINGS: There is no evidence of fracture or dislocation. The joint spaces are preserved. No significant degenerative change is seen; the patellofemoral joint is grossly unremarkable in appearance. A moderate knee joint effusion is noted. The visualized soft tissues are otherwise unremarkable. IMPRESSION: 1. No evidence of fracture or dislocation. 2. Moderate knee joint effusion noted. Electronically Signed   By: Garald Balding M.D.   On: 12/13/2015 22:25    Assessment: Upper GI bleed, Mallory-Weiss tear versus peptic ulcer versus esophagogastric varices Plan:  PPI Transfuse EGD today. Kimberlye Dilger C 12/14/2015, 8:30 AM  Pager [phone] If no answer or after 5 PM call [phone]

## 2015-12-14 NOTE — Op Note (Signed)
[facility] Patient Name: Colin Warner Procedure Date : 12/14/2015 MRN: [id number] Attending MD: Wonda Horner , MD Date of Birth: [birth year] CSN: [id number] Age: 55 Admit Type: Inpatient Procedure:                Upper GI endoscopy Indications:              Hematemesis, Melena Providers:                Wonda Horner, MD, Cleda Daub, RN, Otilio Saber, Technician, Rhae Lerner, CRNA Referring MD:              Medicines:                Propofol per Anesthesia Complications:            No immediate complications. Estimated Blood Loss:     Estimated blood loss: none. Procedure:                Pre-Anesthesia Assessment:                           - Prior to the procedure, a History and Physical                            was performed, and patient medications and                            allergies were reviewed. The patient's tolerance of                            previous anesthesia was also reviewed. The risks                            and benefits of the procedure and the sedation                            options and risks were discussed with the patient.                            All questions were answered, and informed consent                            was obtained. Prior Anticoagulants: The patient has                            taken no previous anticoagulant or antiplatelet                            agents. ASA Grade Assessment: II - A patient with                            mild systemic disease. After reviewing the risks  and benefits, the patient was deemed in                            satisfactory condition to undergo the procedure.                           After obtaining informed consent, the endoscope was                            passed under direct vision. Throughout the                            procedure, the patient's blood pressure, pulse, and                            oxygen  saturations were monitored continuously. The                            [id number]) scope was introduced through the                            mouth, and advanced to the second part of duodenum.                            The upper GI endoscopy was accomplished without                            difficulty. The patient tolerated the procedure                            well. Scope In: Scope Out: Findings:      The examined esophagus was normal. No varices.      The entire examined stomach was normal.      Multiple, small erosions/ulcerations were found in the duodenal bulb.       Edematous friable mucosa in bulb and 2nd portion. Impression:               - Normal esophagus.                           - Normal stomach.                           - Erosive duodenopathy.                           - No specimens collected. Moderate Sedation:      . Recommendation:           - Full liquid diet.                           - Continue present medications.                           - check H pylori antibody  OK to stop octreotide.                           PPI                           Avoid alcohol and Nsaids. Procedure Code(s):        --- Professional ---                           [id number], Esophagogastroduodenoscopy, flexible,                            transoral; diagnostic, including collection of                            specimen(s) by brushing or washing, when performed                            (separate procedure) Diagnosis Code(s):        --- Professional ---                           K31.89, Other diseases of stomach and duodenum                           K92.0, Hematemesis                           K92.1, Melena (includes Hematochezia) CPT copyright 2016 [employer]. All rights reserved. The codes documented in this report are preliminary and upon coder review may  be revised to meet current compliance requirements. Wonda Horner,  MD 12/14/2015 1:10:13 PM This report has been signed electronically. Number of Addenda: 0

## 2015-12-14 NOTE — Anesthesia Postprocedure Evaluation (Signed)
Anesthesia Post Note  Patient: Colin Warner  Procedure(s) Performed: Procedure(s) (LRB): ESOPHAGOGASTRODUODENOSCOPY (EGD) (N/A)  Patient location during evaluation: Endoscopy Anesthesia Type: MAC Level of consciousness: awake Pain management: pain level controlled Vital Signs Assessment: post-procedure vital signs reviewed and stable Respiratory status: spontaneous breathing Cardiovascular status: stable Postop Assessment: no signs of nausea or vomiting Anesthetic complications: no    Last Vitals:  Vitals:   12/14/15 1144 12/14/15 1300  BP: (!) 127/94 130/86  Pulse: 76 92  Resp: 14 (!) 21  Temp: 36.4 C 36.3 C    Last Pain:  Vitals:   12/14/15 1300  TempSrc: Oral  PainSc:                  [place]

## 2015-12-15 LAB — CBC
HEMATOCRIT: 25.4 % — AB (ref 39.0–52.0)
HEMOGLOBIN: 8.4 g/dL — AB (ref 13.0–17.0)
MCH: 29.5 pg (ref 26.0–34.0)
MCHC: 33.1 g/dL (ref 30.0–36.0)
MCV: 89.1 fL (ref 78.0–100.0)
Platelets: 352 10*3/uL (ref 150–400)
RBC: 2.85 MIL/uL — AB (ref 4.22–5.81)
RDW: 13.9 % (ref 11.5–15.5)
WBC: 5.6 10*3/uL (ref 4.0–10.5)

## 2015-12-15 LAB — URINE CULTURE: Culture: NO GROWTH

## 2015-12-15 MED ORDER — OMEPRAZOLE 20 MG PO CPDR
20.0000 mg | DELAYED_RELEASE_CAPSULE | Freq: Two times a day (BID) | ORAL | 1 refills | Status: DC
Start: 2015-12-15 — End: 2016-01-22

## 2015-12-15 NOTE — Clinical Social Work Note (Signed)
Clinical Social Work Assessment  Patient Details  Name: Colin Warner MRN: [id number] Date of Birth: 1961-01-17  Date of referral:  12/15/15               Reason for consult:  Substance Use/ETOH Abuse                Permission sought to share information with:  Family Supports Permission granted to share information::  Yes, Verbal Permission Granted  Name::     Financial risk analyst::  none  Relationship::  wife  Contact Information:     Housing/Transportation Living arrangements for the past 2 months:  Single Family Home Source of Information:  Patient Patient Interpreter Needed:  None Criminal Activity/Legal Involvement Pertinent to Current Situation/Hospitalization:  No - Comment as needed Significant Relationships:  Spouse Lives with:  Spouse Do you feel safe going back to the place where you live?  Yes Need for family participation in patient care:  No (Coment)  Care giving concerns:  none   Facilities manager / plan:  CSW met with patient to discuss current ETOH abuse.  Pt states that his wife will be here shortly- confirmed with patient it would be ok to discuss his drinking if wife arrived.  Pt states that he drinks 2-3 40oz beers a night and has done son for past 15 or so years- has not attempted to quit for years.  Pt reports that he does not have a drinking problem but just drinks nightly after work to relieve stress but states it does not affect his work or his relationships.  Does state his wife wishes he didn't drink for religious reasons but states she has expressed no concerns with his behavior while drinking.   CSW provided pt with list of outpatient ETOH abuse resources though pt does not seem to think he needs one- also suggested he speak with MD about possible health related concerns with his current drinking.  Employment status:    Insurance information:  Self Pay (Medicaid Pending) PT Recommendations:  Not assessed at this time Information / Referral to  community resources:  Outpatient Substance Abuse Treatment Options  Patient/Family's Response to care: Pt not agreeable to attending rehab at this time but is interested in discussing medical concerns with continued drinking with MD and that he might be motivated to quit or reduce after that conversation.  Patient/Family's Understanding of and Emotional Response to Diagnosis, Current Treatment, and Prognosis:  Pt has no concerns with his drinking habits at this time and is hopeful he will be well enough to leave the hospital soon.  Emotional Assessment Appearance:  Appears older than stated age Attitude/Demeanor/Rapport:    Affect (typically observed):  Appropriate Orientation:  Oriented to Situation, Oriented to  Time, Oriented to Place, Oriented to Self Alcohol / Substance use:  Alcohol Use Psych involvement (Current and /or in the community):     Discharge Needs  Concerns to be addressed:  Substance Abuse Concerns Readmission within the last 30 days:  No Current discharge risk:  Substance Abuse Barriers to Discharge:  Continued Medical Work up   Jorge Ny, LCSW 12/15/2015, 10:39 AM

## 2015-12-15 NOTE — Progress Notes (Signed)
[facility] Progress Note  Subjective: Feels okay, no stools since yesterday  Objective: Vital signs in last 24 hours: Temp:  [97.3 F (36.3 C)-98.3 F (36.8 C)] 98 F (36.7 C) (09/19 0509) Pulse Rate:  [74-106] 74 (09/19 0509) Resp:  [14-24] 18 (09/19 0509) BP: (114-136)/(76-94) 114/76 (09/19 0509) SpO2:  [99 %-100 %] 100 % (09/19 0509) Weight:  [59.7 kg (131 lb 11.2 oz)] 59.7 kg (131 lb 11.2 oz) (09/18 1144) Weight change: -6.033 kg (-13 lb 4.8 oz)   PE: More alert, abdomen soft.  Lab Results: Results for orders placed or performed during the hospital encounter of 12/13/15 (from the past 24 hour(s))  Hemoglobin and hematocrit, blood     Status: Abnormal   Collection Time: 12/14/15  3:55 PM  Result Value Ref Range   Hemoglobin 9.2 (L) 13.0 - 17.0 g/dL   HCT 28.3 (L) 39.0 - 52.0 %  CBC     Status: Abnormal   Collection Time: 12/15/15  4:23 AM  Result Value Ref Range   WBC 5.6 4.0 - 10.5 K/uL   RBC 2.85 (L) 4.22 - 5.81 MIL/uL   Hemoglobin 8.4 (L) 13.0 - 17.0 g/dL   HCT 25.4 (L) 39.0 - 52.0 %   MCV 89.1 78.0 - 100.0 fL   MCH 29.5 26.0 - 34.0 pg   MCHC 33.1 30.0 - 36.0 g/dL   RDW 13.9 11.5 - 15.5 %   Platelets 352 150 - 400 K/uL    Studies/Results: Dg Knee Complete 4 Views Left  Result Date: 12/13/2015 CLINICAL DATA:  Chronic left knee pain. Question of twisting injury to left knee. Assess for knee joint effusion. Initial encounter. EXAM: LEFT KNEE - COMPLETE 4+ VIEW COMPARISON:  None. FINDINGS: There is no evidence of fracture or dislocation. The joint spaces are preserved. No significant degenerative change is seen; the patellofemoral joint is grossly unremarkable in appearance. A moderate knee joint effusion is noted. The visualized soft tissues are otherwise unremarkable. IMPRESSION: 1. No evidence of fracture or dislocation. 2. Moderate knee joint effusion noted. Electronically Signed   By: Garald Balding M.D.   On: 12/13/2015 22:25       Assessment: Upper GI bleed with duodenal erosions found by EGD with low stigmata of hemorrhage   Plan: Continue PPI Await H. pylori antibody and treat for eradication of Helicobacter if positive Will advance diet Avoid NSAIDs    Avah Bashor C 12/15/2015, 9:13 AM  Pager [phone] If no answer or after 5 PM call [phone]

## 2015-12-15 NOTE — Discharge Summary (Signed)
Physician Discharge Summary  Colin Warner [id number] DOB: June 09, 1960 DOA: 12/13/2015  PCP: No PCP Per Patient  Admit date: 12/13/2015 Discharge date: 12/15/2015  Admitted From: Home Disposition:  Home  Recommendations for Outpatient Follow-up:  1. Follow up with PCP in 1-2 weeks 2. Please obtain CBC   Home Health: NO Equipment/Devices:none  Discharge Condition:stable CODE STATUS:*FULL Diet recommendation: Heart Healthy   Brief/Interim Summary: 55 year old male with a history of alcohol dependence, hypertension presented with one-day history of hematemesis and dizziness. On 12/11/2015, the patient noted some melanotic stools. Over the weekend, the patient continued to have some abdominal discomfort. He has some intermittent nausea, but developed hematemesis on 12/13/2015. He continues to drink 2-3 x 40 oz beers daily for the past 10 years. He denies any other illegal drug use. Upon presentation, the patient was noted to have R1 19, BP 120/70. Due to concerns of chronic liver disease with possible variceal bleed, the patient was started on octreotide, ceftriaxone, and Protonix. GI was consulted to assist with management.  The patient presented with a hemoglobin of 9.5. Unfortunately, his hemoglobin continued to drop and required transfusion 2 units PRBC.  Discharge Diagnoses:  Hematemesis/upper GI bleed -Appreciate GI consult -Continue PPI twice a day x 1 month, then once daily -stop Etoh -12/14/2015 EGD--erosive due duodenopathy -Discontinue octreotide -Advance diet to Regular diet which the patient tolerated -No further hematemesis -Discontinue antibiotics -INR 1.18  Acute blood loss anemia -Secondary to hematemesis -Transfuse 2 units PRBC -Remained hemodynamically stable -Drop in hemoglobin partly dilutional  Lactic acidosis -Secondary to volume depletion -Transfused PRBC as discussed -IV boluses given -Improved clinically  Left knee pain/ left knee  effusion -No objective evidence for septic joint -ESR 6, CRP <0.5 -Patient gives history of possible recent twisting injury in the past 2-3 weeks -X-ray negative for bony abnormalities -Conservative care  HTN -stable with hold lisinopril--will not restart   Alcohol dependence -CIWA  Hyperlipidemia -continue statin  Tobacco Abuse -nicoderm patch -cessation discussed   Discharge Instructions  Discharge Instructions    Diet - low sodium heart healthy    Complete by:  As directed    Increase activity slowly    Complete by:  As directed        Medication List    STOP taking these medications   aspirin EC 325 MG tablet   ibuprofen 200 MG tablet Commonly known as:  ADVIL,MOTRIN   lisinopril 20 MG tablet Commonly known as:  PRINIVIL,ZESTRIL     TAKE these medications   omeprazole 20 MG capsule Commonly known as:  PRILOSEC Take 1 capsule (20 mg total) by mouth 2 (two) times daily before a meal. For 1 month, then once daily   simvastatin 20 MG tablet Commonly known as:  ZOCOR Take 1 tablet (20 mg total) by mouth daily. What changed:  when to take this      [place] .   Why:  Call to schedule a follow up appointment to establish a primary care provider. Contact information: [address] [place] [address] [phone]         No Known Allergies  Consultations:  [facility]   Procedures/Studies: Dg Knee Complete 4 Views Left  Result Date: 12/13/2015 CLINICAL DATA:  Chronic left knee pain. Question of twisting injury to left knee. Assess for knee joint effusion. Initial encounter. EXAM: LEFT KNEE - COMPLETE 4+ VIEW COMPARISON:  None. FINDINGS: There is no evidence of fracture  or dislocation. The joint spaces are preserved. No significant degenerative change is seen; the patellofemoral joint is grossly unremarkable in appearance. A moderate knee joint effusion is noted. The  visualized soft tissues are otherwise unremarkable. IMPRESSION: 1. No evidence of fracture or dislocation. 2. Moderate knee joint effusion noted. Electronically Signed   By: Garald Balding M.D.   On: 12/13/2015 22:25        Discharge Exam: Vitals:   12/14/15 2200 12/15/15 0509  BP: 131/88 114/76  Pulse: 81 74  Resp: 18 18  Temp: 98 F (36.7 C) 98 F (36.7 C)   Vitals:   12/14/15 1310 12/14/15 2036 12/14/15 2200 12/15/15 0509  BP: (!) 126/94 136/90 131/88 114/76  Pulse: 94 (!) 106 81 74  Resp: (!) '24 15 18 18  ' Temp:  98.3 F (36.8 C) 98 F (36.7 C) 98 F (36.7 C)  TempSrc:  Oral Oral Oral  SpO2: 100% 99% 100% 100%  Weight:      Height:        General: Pt is alert, awake, not in acute distress Cardiovascular: RRR, S1/S2 +, no rubs, no gallops Respiratory: CTA bilaterally, no wheezing, no rhonchi Abdominal: Soft, NT, ND, bowel sounds + Extremities: no edema, no cyanosis   The results of significant diagnostics from this hospitalization (including imaging, microbiology, ancillary and laboratory) are listed below for reference.    Significant Diagnostic Studies: Dg Knee Complete 4 Views Left  Result Date: 12/13/2015 CLINICAL DATA:  Chronic left knee pain. Question of twisting injury to left knee. Assess for knee joint effusion. Initial encounter. EXAM: LEFT KNEE - COMPLETE 4+ VIEW COMPARISON:  None. FINDINGS: There is no evidence of fracture or dislocation. The joint spaces are preserved. No significant degenerative change is seen; the patellofemoral joint is grossly unremarkable in appearance. A moderate knee joint effusion is noted. The visualized soft tissues are otherwise unremarkable. IMPRESSION: 1. No evidence of fracture or dislocation. 2. Moderate knee joint effusion noted. Electronically Signed   By: Garald Balding M.D.   On: 12/13/2015 22:25     Microbiology: Recent Results (from the past 240 hour(s))  Urine culture     Status: None   Collection Time: 12/13/15   4:04 PM  Result Value Ref Range Status   Specimen Description URINE, RANDOM  Final   Special Requests NONE  Final   Culture NO GROWTH  Final   Report Status 12/15/2015 FINAL  Final  Culture, blood (routine x 2)     Status: Abnormal (Preliminary result)   Collection Time: 12/13/15  9:24 PM  Result Value Ref Range Status   Specimen Description BLOOD BLOOD RIGHT FOREARM  Final   Special Requests IN PEDIATRIC BOTTLE 1CC  Final   Culture  Setup Time   Final    GRAM POSITIVE COCCI AEROBIC BOTTLE ONLY CRITICAL RESULT CALLED TO, READ BACK BY AND VERIFIED WITH: JBuelah Manis, PHARMD AT 2035 ON 12/14/15 BY C. JESSUP, MLT.    Culture STAPHYLOCOCCUS SPECIES (COAGULASE NEGATIVE) (A)  Final   Report Status PENDING  Incomplete  Blood Culture ID Panel (Reflexed)     Status: Abnormal   Collection Time: 12/13/15  9:24 PM  Result Value Ref Range Status   Enterococcus species NOT DETECTED NOT DETECTED Final   Listeria monocytogenes NOT DETECTED NOT DETECTED Final   Staphylococcus species DETECTED (A) NOT DETECTED Final    Comment: CRITICAL RESULT CALLED TO, READ BACK BY AND VERIFIED WITH: JBuelah Manis, PHARMD AT 2035 ON 12/14/15 BY C. JESSUP,  MLT.    Staphylococcus aureus NOT DETECTED NOT DETECTED Final   Methicillin resistance DETECTED (A) NOT DETECTED Final    Comment: CRITICAL RESULT CALLED TO, READ BACK BY AND VERIFIED WITH: JBuelah Manis, PHARMD AT 2035 ON 12/14/15 BY C. JESSUP, MLT.    Streptococcus species NOT DETECTED NOT DETECTED Final   Streptococcus agalactiae NOT DETECTED NOT DETECTED Final   Streptococcus pneumoniae NOT DETECTED NOT DETECTED Final   Streptococcus pyogenes NOT DETECTED NOT DETECTED Final   Acinetobacter baumannii NOT DETECTED NOT DETECTED Final   Enterobacteriaceae species NOT DETECTED NOT DETECTED Final   Enterobacter cloacae complex NOT DETECTED NOT DETECTED Final   Escherichia coli NOT DETECTED NOT DETECTED Final   Klebsiella oxytoca NOT DETECTED NOT DETECTED Final   Klebsiella  pneumoniae NOT DETECTED NOT DETECTED Final   Proteus species NOT DETECTED NOT DETECTED Final   Serratia marcescens NOT DETECTED NOT DETECTED Final   Haemophilus influenzae NOT DETECTED NOT DETECTED Final   Neisseria meningitidis NOT DETECTED NOT DETECTED Final   Pseudomonas aeruginosa NOT DETECTED NOT DETECTED Final   Candida albicans NOT DETECTED NOT DETECTED Final   Candida glabrata NOT DETECTED NOT DETECTED Final   Candida krusei NOT DETECTED NOT DETECTED Final   Candida parapsilosis NOT DETECTED NOT DETECTED Final   Candida tropicalis NOT DETECTED NOT DETECTED Final  Culture, blood (routine x 2)     Status: None (Preliminary result)   Collection Time: 12/13/15  9:47 PM  Result Value Ref Range Status   Specimen Description BLOOD RIGHT HAND  Final   Special Requests BOTTLES DRAWN AEROBIC ONLY 5CC  Final   Culture NO GROWTH 2 DAYS  Final   Report Status PENDING  Incomplete  MRSA PCR Screening     Status: None   Collection Time: 12/13/15 10:57 PM  Result Value Ref Range Status   MRSA by PCR NEGATIVE NEGATIVE Final    Comment:        The GeneXpert MRSA Assay (FDA approved for NASAL specimens only), is one component of a comprehensive MRSA colonization surveillance program. It is not intended to diagnose MRSA infection nor to guide or monitor treatment for MRSA infections.      Labs: Basic Metabolic Panel:  Recent Labs Lab 12/13/15 1635 12/14/15 0329  NA 136 138  K 3.9 4.0  CL 104 110  CO2 25 23  GLUCOSE 123* 158*  BUN 37* 24*  CREATININE 1.04 1.02  CALCIUM 8.6* 7.6*   Liver Function Tests:  Recent Labs Lab 12/13/15 1635 12/14/15 0329  AST 17 13*  ALT 11* 8*  ALKPHOS 49 39  BILITOT 0.7 0.3  PROT 6.1* 4.8*  ALBUMIN 3.2* 2.6*    Recent Labs Lab 12/13/15 1635  LIPASE 25   No results for input(s): AMMONIA in the last 168 hours. CBC:  Recent Labs Lab 12/13/15 1635 12/13/15 2349 12/14/15 0329 12/14/15 1555 12/15/15 0423  WBC 14.9* 11.2* 6.8  --   5.6  NEUTROABS 12.4*  --   --   --   --   HGB 9.5* 7.5* 6.7* 9.2* 8.4*  HCT 29.3* 22.7* 20.0* 28.3* 25.4*  MCV 88.5 89.0 87.3  --  89.1  PLT 415* 320 354  --  352   Cardiac Enzymes: No results for input(s): CKTOTAL, CKMB, CKMBINDEX, TROPONINI in the last 168 hours. BNP: Invalid input(s): POCBNP CBG: No results for input(s): GLUCAP in the last 168 hours.  Time coordinating discharge:  Greater than 30 minutes  Signed:  Jolana Runkles, DO [facility] Pager: [phone] 12/15/2015, 2:49 PM

## 2015-12-15 NOTE — Care Management Note (Signed)
Case Management Note  Patient Details  Name: Stevon Brownson MRN: [id number] Date of Birth: [birth year]  Subjective/Objective:                 Patient in obs for erosive esophagitis. Hgb stable. From home with wife. No PCP listed, instructed to follow up with [facility] to establish PCP. Patient will need to call to make appointment beginning of next week. Added to AVS. No further CM needs identified.   Action/Plan:  DC to home when cleared by GI.   Expected Discharge Date:  12/16/15               Expected Discharge Plan:  Home/Self Care  In-House Referral:  NA  Discharge planning Services  CM Consult, [facility]  Post Acute Care Choice:  NA Choice offered to:  NA  DME Arranged:  N/A DME Agency:  NA  HH Arranged:  NA HH Agency:  NA  Status of Service:  Completed, signed off  If discussed at [place] of Stay Meetings, dates discussed:    Additional Comments:  Carles Collet, RN 12/15/2015, 10:34 AM

## 2015-12-16 ENCOUNTER — Other Ambulatory Visit: Payer: Self-pay | Admitting: Family Medicine

## 2015-12-16 LAB — CULTURE, BLOOD (ROUTINE X 2)

## 2015-12-16 LAB — TYPE AND SCREEN
ABO/RH(D): A POS
Antibody Screen: NEGATIVE
Unit division: 0
Unit division: 0
Unit division: 0

## 2015-12-18 LAB — CULTURE, BLOOD (ROUTINE X 2): CULTURE: NO GROWTH

## 2015-12-21 ENCOUNTER — Inpatient Hospital Stay: Payer: Self-pay

## 2015-12-28 ENCOUNTER — Inpatient Hospital Stay: Payer: Self-pay | Admitting: Family Medicine

## 2016-01-22 ENCOUNTER — Encounter: Payer: Self-pay | Admitting: Family Medicine

## 2016-01-22 ENCOUNTER — Ambulatory Visit: Payer: Self-pay | Attending: Family Medicine | Admitting: Family Medicine

## 2016-01-22 VITALS — BP 149/85 | HR 76 | Temp 98.1°F | Ht 66.0 in | Wt 131.4 lb

## 2016-01-22 DIAGNOSIS — I10 Essential (primary) hypertension: Secondary | ICD-10-CM | POA: Insufficient documentation

## 2016-01-22 DIAGNOSIS — F1020 Alcohol dependence, uncomplicated: Secondary | ICD-10-CM | POA: Insufficient documentation

## 2016-01-22 DIAGNOSIS — M542 Cervicalgia: Secondary | ICD-10-CM | POA: Insufficient documentation

## 2016-01-22 DIAGNOSIS — K922 Gastrointestinal hemorrhage, unspecified: Secondary | ICD-10-CM | POA: Insufficient documentation

## 2016-01-22 LAB — CBC WITH DIFFERENTIAL/PLATELET
BASOS ABS: 0 {cells}/uL (ref 0–200)
BASOS PCT: 0 %
EOS ABS: 0 {cells}/uL — AB (ref 15–500)
Eosinophils Relative: 0 %
HEMATOCRIT: 36.6 % — AB (ref 38.5–50.0)
HEMOGLOBIN: 11.7 g/dL — AB (ref 13.2–17.1)
LYMPHS ABS: 1848 {cells}/uL (ref 850–3900)
Lymphocytes Relative: 24 %
MCH: 26.4 pg — AB (ref 27.0–33.0)
MCHC: 32 g/dL (ref 32.0–36.0)
MCV: 82.4 fL (ref 80.0–100.0)
MONO ABS: 616 {cells}/uL (ref 200–950)
MPV: 7.8 fL (ref 7.5–12.5)
Monocytes Relative: 8 %
NEUTROS ABS: 5236 {cells}/uL (ref 1500–7800)
Neutrophils Relative %: 68 %
Platelets: 764 10*3/uL — ABNORMAL HIGH (ref 140–400)
RBC: 4.44 MIL/uL (ref 4.20–5.80)
RDW: 15.5 % — ABNORMAL HIGH (ref 11.0–15.0)
WBC: 7.7 10*3/uL (ref 3.8–10.8)

## 2016-01-22 MED ORDER — OMEPRAZOLE 20 MG PO CPDR: 20.0000 mg | DELAYED_RELEASE_CAPSULE | Freq: Two times a day (BID) | ORAL | 1 refills | Status: DC

## 2016-01-22 MED ORDER — LISINOPRIL 5 MG PO TABS: 5.0000 mg | ORAL_TABLET | Freq: Every day | ORAL | 3 refills | Status: DC

## 2016-01-22 MED ORDER — CYCLOBENZAPRINE HCL 10 MG PO TABS: 10.0000 mg | ORAL_TABLET | Freq: Every day | ORAL | 0 refills | Status: DC

## 2016-01-22 NOTE — Progress Notes (Signed)
Subjective:  Patient ID: Colin Warner, male    DOB: 12/28/1960  Age: 55 y.o. MRN: [id number]  CC: Hospitalization Follow-up; GI Bleeding; and Hypertension   HPI Colin Warner is a 55 year old male with a history of Hypertension, alcohol abuse who presents to establish care after hospitalization at [facility] from 9/17-9/19/17 for upper GI bleed .  He received 2 units PRBC as hemoglobin dropped to a low of 7.5 and was placed on Octreotide, antibioticse and PPI. Upper endoscopy revealed erosive duodenopathy. He was maintained on CIWA protocol for alcohol dependence. Hemoglobin improved to 8.4 and he was subsequently discharged on a PPI.  He denies any hematochezia or hematemesis since discharge and has not had an alcohol since hospitalization. He is concerned about left sided neck pain he has had since hospitalization which restricts his range of motion. Denies numbness down extremities   Past Medical History:  Diagnosis Date  . Hypertension    Past Surgical History:  Procedure Laterality Date  . ESOPHAGOGASTRODUODENOSCOPY N/A 12/14/2015   Procedure: ESOPHAGOGASTRODUODENOSCOPY (EGD);  Surgeon: Wonda Horner, MD;  [facility] ENDOSCOPY;  Service: Endoscopy;  Laterality: N/A;  . Urologic surgery, unknown, for hematuria       No Known Allergies  Outpatient Medications Prior to Visit  Medication Sig Dispense Refill  . simvastatin (ZOCOR) 20 MG tablet Take 1 tablet (20 mg total) by mouth daily. (Patient taking differently: Take 20 mg by mouth at bedtime. ) 90 tablet 0  . omeprazole (PRILOSEC) 20 MG capsule Take 1 capsule (20 mg total) by mouth 2 (two) times daily before a meal. For 1 month, then once daily 60 capsule 1   No facility-administered medications prior to visit.     ROS Review of Systems  Constitutional: Negative for activity change and appetite change.  HENT: Negative for sinus pressure and sore throat.   Eyes: Negative for visual disturbance.    Respiratory: Negative for cough, chest tightness and shortness of breath.   Cardiovascular: Negative for chest pain and leg swelling.  Gastrointestinal: Negative for abdominal distention, abdominal pain, constipation and diarrhea.  Endocrine: Negative.   Genitourinary: Negative for dysuria.  Musculoskeletal: Positive for neck pain. Negative for joint swelling and myalgias.  Skin: Negative for rash.  Allergic/Immunologic: Negative.   Neurological: Negative for weakness, light-headedness and numbness.  Psychiatric/Behavioral: Negative for dysphoric mood and suicidal ideas.    Objective:  BP (!) 149/85 (BP Location: Right Arm, Patient Position: Sitting, Cuff Size: Large)   Pulse 76   Temp 98.1 F (36.7 C) (Oral)   Ht 5\' 6"  (1.676 m)   Wt 131 lb 6.4 oz (59.6 kg)   SpO2 100%   BMI 21.21 kg/m   BP/Weight 01/22/2016 12/15/2015 [id number]  Systolic BP 123456 [id number] -  Diastolic BP 85 76 -  Wt. (Lbs) 131.4 - 131.7  BMI 21.21 - 21.92      Physical Exam  Constitutional: He is oriented to person, place, and time. He appears well-developed and well-nourished.  Cardiovascular: Normal rate, normal heart sounds and intact distal pulses.   No murmur heard. Pulmonary/Chest: Effort normal and breath sounds normal. He has no wheezes. He has no rales. He exhibits no tenderness.  Abdominal: Soft. Bowel sounds are normal. He exhibits no distension and no mass. There is no tenderness.  Musculoskeletal: He exhibits tenderness (tenderness on palpation of the left sternocleidomastoid muscle and trapezius; left lateral rotation of neck limited to 30 and right lateral rotation is  70 degrees from midline).  Neurological: He is alert and oriented to person, place, and time.     Assessment & Plan:   1. Essential hypertension Uncontrolled as he is not on any antihypertensive Placed on lisinopril which he was previously taking - lisinopril (PRINIVIL,ZESTRIL) 5 MG tablet; Take 1 tablet (5 mg total) by mouth  daily.  Dispense: 30 tablet; Refill: 3  2. Acute upper GI bleed No recent hematemesis or hematochezia - omeprazole (PRILOSEC) 20 MG capsule; Take 1 capsule (20 mg total) by mouth 2 (two) times daily before a meal. For 1 month, then once daily  Dispense: 60 capsule; Refill: 1 - CBC with Differential/Platelet  3. 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  Dispense: 30 tablet; Refill: 0   Meds ordered this encounter  Medications  . omeprazole (PRILOSEC) 20 MG capsule    Sig: Take 1 capsule (20 mg total) by mouth 2 (two) times daily before a meal. For 1 month, then once daily    Dispense:  60 capsule    Refill:  1  . cyclobenzaprine (FLEXERIL) 10 MG tablet    Sig: Take 1 tablet (10 mg total) by mouth at bedtime.    Dispense:  30 tablet    Refill:  0  . lisinopril (PRINIVIL,ZESTRIL) 5 MG tablet    Sig: Take 1 tablet (5 mg total) by mouth daily.    Dispense:  30 tablet    Refill:  3    Follow-up: Return in about 3 weeks (around 02/12/2016) for Follow-up of neck pain.   Arnoldo Morale MD

## 2016-01-27 ENCOUNTER — Telehealth: Payer: Self-pay

## 2016-01-27 NOTE — Telephone Encounter (Signed)
-----   Message from Arnoldo Morale, MD sent at 01/25/2016  1:58 PM EDT ----- Please inform him that his anemia has improved

## 2016-01-27 NOTE — Telephone Encounter (Signed)
Writer called patient to let him know that his anemia has improved.  Patient stated understanding.

## 2016-02-17 ENCOUNTER — Ambulatory Visit: Payer: Self-pay | Attending: Family Medicine | Admitting: Family Medicine

## 2016-02-17 ENCOUNTER — Encounter: Payer: Self-pay | Admitting: Family Medicine

## 2016-02-17 VITALS — BP 138/90 | HR 87 | Temp 98.3°F | Ht 65.0 in | Wt 139.6 lb

## 2016-02-17 DIAGNOSIS — M542 Cervicalgia: Secondary | ICD-10-CM | POA: Insufficient documentation

## 2016-02-17 DIAGNOSIS — I10 Essential (primary) hypertension: Secondary | ICD-10-CM | POA: Insufficient documentation

## 2016-02-17 DIAGNOSIS — M5431 Sciatica, right side: Secondary | ICD-10-CM

## 2016-02-17 DIAGNOSIS — K922 Gastrointestinal hemorrhage, unspecified: Secondary | ICD-10-CM | POA: Insufficient documentation

## 2016-02-17 DIAGNOSIS — M5430 Sciatica, unspecified side: Secondary | ICD-10-CM | POA: Insufficient documentation

## 2016-02-17 MED ORDER — SIMVASTATIN 20 MG PO TABS: 20.0000 mg | ORAL_TABLET | Freq: Every day | ORAL | 3 refills | Status: DC

## 2016-02-17 MED ORDER — OMEPRAZOLE 20 MG PO CPDR: 20.0000 mg | DELAYED_RELEASE_CAPSULE | Freq: Two times a day (BID) | ORAL | 1 refills | Status: DC

## 2016-02-17 MED ORDER — LISINOPRIL 20 MG PO TABS: 20.0000 mg | ORAL_TABLET | Freq: Every day | ORAL | 3 refills | Status: DC

## 2016-02-17 MED ORDER — CYCLOBENZAPRINE HCL 10 MG PO TABS: 10.0000 mg | ORAL_TABLET | Freq: Every day | ORAL | 0 refills | Status: DC

## 2016-02-17 MED FILL — CYCLOBENZAPRINE 10 MG TAB: 10 | 30 days supply | Qty: 30 | Fill #0

## 2016-02-17 MED FILL — ?LISINOPRIL 20 MG TABLET: 20 | 30 days supply | Qty: 30 | Fill #0

## 2016-02-17 MED FILL — ?SIMVASTATIN 20 MG TABLET: 20 MG | 30 days supply | Qty: 30 | Fill #0

## 2016-02-17 MED FILL — ?OMEPRAZOLE DR 20 MG CAPSUL: 20 | 30 days supply | Qty: 60 | Fill #0

## 2016-02-17 NOTE — Progress Notes (Signed)
   Subjective:    Patient ID: Colin Warner, male    DOB: November 01, 1960, 55 y.o.   MRN: [id number]  HPI He is a 55 year old male with a history of hypertension, alcohol abuse, hospitalization 2 months ago for upper GI bleed. His blood pressure had been elevated at his last office visit and he was restarted on lisinopril. Neck pain which he complained of at his last office visit has resolved and he states he is able to return to work with no restrictions.  He denies any hematochezia. Complains of three-day history of pain radiating from his right low back anteriorly towards his right groin more when he gets up in the morning.  Past Medical History:  Diagnosis Date  . Hypertension     Past Surgical History:  Procedure Laterality Date  . ESOPHAGOGASTRODUODENOSCOPY N/A 12/14/2015   Procedure: ESOPHAGOGASTRODUODENOSCOPY (EGD);  Surgeon: Wonda Horner, MD;  Location: [facility] ENDOSCOPY;  Service: Endoscopy;  Laterality: N/A;  . Urologic surgery, unknown, for hematuria       No Known Allergies   Review of Systems  Constitutional: Negative for activity change and appetite change.  HENT: Negative for sinus pressure and sore throat.   Respiratory: Negative for chest tightness, shortness of breath and wheezing.   Cardiovascular: Negative for chest pain and palpitations.  Gastrointestinal: Negative for abdominal distention, abdominal pain and constipation.  Genitourinary: Negative.   Musculoskeletal:       Right-sided back pain radiating to the right groin  Psychiatric/Behavioral: Negative for behavioral problems and dysphoric mood.       Objective: Vitals:   02/17/16 1436  BP: 138/90  Pulse: 87  Temp: 98.3 F (36.8 C)  TempSrc: Oral  SpO2: 99%  Weight: 139 lb 9.6 oz (63.3 kg)  Height: 5\' 5"  (1.651 m)      Physical Exam  Constitutional: He is oriented to person, place, and time. He appears well-developed and well-nourished.  Cardiovascular: Normal rate, normal heart sounds and intact  distal pulses.   No murmur heard. Pulmonary/Chest: Effort normal and breath sounds normal. He has no wheezes. He has no rales. He exhibits no tenderness.  Abdominal: Soft. Bowel sounds are normal. He exhibits no distension and no mass. There is no tenderness.  Musculoskeletal: Normal range of motion.  Positive straight leg raise on the right Tenderness on range of motion of right hip and on palpation of medial aspect of right thigh  Neurological: He is alert and oriented to person, place, and time.          Assessment & Plan:  1. Essential hypertension Uncontrolled  Adjust lisinopril dose to 20 mg daily as that is what he has been taking Low-sodium diet  2. Acute upper GI bleed No recent hematemesis or hematochezia - omeprazole (PRILOSEC) 20 MG capsule; Take 1 capsule (20 mg total) by mouth 2 (two) times daily before a meal. For 1 month, then once daily  Dispense: 60 capsule; Refill: 1   3. Sciatica, Continue Flexeril Demonstrated stretching exercises. - cyclobenzaprine (FLEXERIL) 10 MG tablet; Take 1 tablet (10 mg total) by mouth at bedtime.  Dispense: 30 tablet; Refill: 0  Provided note to return to work.

## 2016-02-17 NOTE — Progress Notes (Signed)
Med refills

## 2016-02-17 NOTE — Patient Instructions (Signed)
Sciatica Sciatica is pain, numbness, weakness, or tingling along the path of the sciatic nerve. The sciatic nerve starts in the lower back and runs down the back of each leg. The nerve controls the muscles in the lower leg and in the back of the knee. It also provides feeling (sensation) to the back of the thigh, the lower leg, and the sole of the foot. Sciatica is a symptom of another medical condition that pinches or puts pressure on the sciatic nerve. Generally, sciatica only affects one side of the body. Sciatica usually goes away on its own or with treatment. In some cases, sciatica may keep coming back (recur). What are the causes? This condition is caused by pressure on the sciatic nerve, or pinching of the sciatic nerve. This may be the result of:  A disk in between the bones of the spine (vertebrae) bulging out too far (herniated disk).  Age-related changes in the spinal disks (degenerative disk disease).  A pain disorder that affects a muscle in the buttock (piriformis syndrome).  Extra bone growth (bone spur) near the sciatic nerve.  An injury or break (fracture) of the pelvis.  Pregnancy.  Tumor (rare). What increases the risk? The following factors may make you more likely to develop this condition:  Playing sports that place pressure or stress on the spine, such as football or weight lifting.  Having poor strength and flexibility.  A history of back injury.  A history of back surgery.  Sitting for long periods of time.  Doing activities that involve repetitive bending or lifting.  Obesity. What are the signs or symptoms? Symptoms can vary from mild to very severe, and they may include:  Any of these problems in the lower back, leg, hip, or buttock:  Mild tingling or dull aches.  Burning sensations.  Sharp pains.  Numbness in the back of the calf or the sole of the foot.  Leg weakness.  Severe back pain that makes movement difficult. These symptoms may  get worse when you cough, sneeze, or laugh, or when you sit or stand for long periods of time. Being overweight may also make symptoms worse. In some cases, symptoms may recur over time. How is this diagnosed? This condition may be diagnosed based on:  Your symptoms.  A physical exam. Your health care provider may ask you to do certain movements to check whether those movements trigger your symptoms.  You may have tests, including:  Blood tests.  X-rays.  MRI.  CT scan. How is this treated? In many cases, this condition improves on its own, without any treatment. However, treatment may include:  Reducing or modifying physical activity during periods of pain.  Exercising and stretching to strengthen your abdomen and improve the flexibility of your spine.  Icing and applying heat to the affected area.  Medicines that help:  To relieve pain and swelling.  To relax your muscles.  Injections of medicines that help to relieve pain, irritation, and inflammation around the sciatic nerve (steroids).  Surgery. Follow these instructions at home: Medicines   Take over-the-counter and prescription medicines only as told by your health care provider.  Do not drive or operate heavy machinery while taking prescription pain medicine. Managing pain   If directed, apply ice to the affected area.  Put ice in a plastic bag.  Place a towel between your skin and the bag.  Leave the ice on for 20 minutes, 2-3 times a day.  After icing, apply heat to the   affected area before you exercise or as often as told by your health care provider. Use the heat source that your health care provider recommends, such as a moist heat pack or a heating pad.  Place a towel between your skin and the heat source.  Leave the heat on for 20-30 minutes.  Remove the heat if your skin turns bright red. This is especially important if you are unable to feel pain, heat, or cold. You may have a greater risk of  getting burned. Activity   Return to your normal activities as told by your health care provider. Ask your health care provider what activities are safe for you.  Avoid activities that make your symptoms worse.  Take brief periods of rest throughout the day. Resting in a lying or standing position is usually better than sitting to rest.  When you rest for longer periods, mix in some mild activity or stretching between periods of rest. This will help to prevent stiffness and pain.  Avoid sitting for long periods of time without moving. Get up and move around at least one time each hour.  Exercise and stretch regularly, as told by your health care provider.  Do not lift anything that is heavier than 10 lb (4.5 kg) while you have symptoms of sciatica. When you do not have symptoms, you should still avoid heavy lifting, especially repetitive heavy lifting.  When you lift objects, always use proper lifting technique, which includes:  Bending your knees.  Keeping the load close to your body.  Avoiding twisting. General instructions   Use good posture.  Avoid leaning forward while sitting.  Avoid hunching over while standing.  Maintain a healthy weight. Excess weight puts extra stress on your back and makes it difficult to maintain good posture.  Wear supportive, comfortable shoes. Avoid wearing high heels.  Avoid sleeping on a mattress that is too soft or too hard. A mattress that is firm enough to support your back when you sleep may help to reduce your pain.  Keep all follow-up visits as told by your health care provider. This is important. Contact a health care provider if:  You have pain that wakes you up when you are sleeping.  You have pain that gets worse when you lie down.  Your pain is worse than you have experienced in the past.  Your pain lasts longer than 4 weeks.  You experience unexplained weight loss. Get help right away if:  You lose control of your bowel  or bladder (incontinence).  You have:  Weakness in your lower back, pelvis, buttocks, or legs that gets worse.  Redness or swelling of your back.  A burning sensation when you urinate. This information is not intended to replace advice given to you by your health care provider. Make sure you discuss any questions you have with your health care provider. Document Released: 03/08/2001 Document Revised: 08/18/2015 Document Reviewed: 11/21/2014 Elsevier Interactive Patient Education  2017 [employer]  

## 2016-03-30 ENCOUNTER — Ambulatory Visit: Payer: Self-pay | Admitting: Family Medicine

## 2016-04-05 ENCOUNTER — Ambulatory Visit: Payer: Self-pay | Attending: Family Medicine | Admitting: Family Medicine

## 2016-04-05 ENCOUNTER — Encounter: Payer: Self-pay | Admitting: Family Medicine

## 2016-04-05 VITALS — BP 165/94 | HR 88 | Temp 98.6°F | Ht 65.0 in | Wt 142.6 lb

## 2016-04-05 DIAGNOSIS — I10 Essential (primary) hypertension: Secondary | ICD-10-CM | POA: Insufficient documentation

## 2016-04-05 DIAGNOSIS — K922 Gastrointestinal hemorrhage, unspecified: Secondary | ICD-10-CM

## 2016-04-05 DIAGNOSIS — M25562 Pain in left knee: Secondary | ICD-10-CM | POA: Insufficient documentation

## 2016-04-05 DIAGNOSIS — E7800 Pure hypercholesterolemia, unspecified: Secondary | ICD-10-CM | POA: Insufficient documentation

## 2016-04-05 MED ORDER — NAPROXEN 500 MG PO TABS: 500.0000 mg | ORAL_TABLET | Freq: Two times a day (BID) | ORAL | 2 refills | Status: DC

## 2016-04-05 MED ORDER — SIMVASTATIN 20 MG PO TABS: 20.0000 mg | ORAL_TABLET | Freq: Every day | ORAL | 3 refills | Status: DC

## 2016-04-05 MED ORDER — CETIRIZINE HCL 10 MG PO TABS: 10.0000 mg | ORAL_TABLET | Freq: Every day | ORAL | 1 refills | Status: DC

## 2016-04-05 MED ORDER — OMEPRAZOLE 20 MG PO CPDR: 20.0000 mg | DELAYED_RELEASE_CAPSULE | Freq: Every day | ORAL | 3 refills | Status: DC

## 2016-04-05 MED ORDER — PREDNISONE 20 MG PO TABS: 20.0000 mg | ORAL_TABLET | Freq: Two times a day (BID) | ORAL | 0 refills | Status: DC

## 2016-04-05 MED ORDER — LISINOPRIL 20 MG PO TABS: 20.0000 mg | ORAL_TABLET | Freq: Every day | ORAL | 3 refills | Status: DC

## 2016-04-05 MED FILL — ?SIMVASTATIN 20 MG TABLET: 20 MG | 30 days supply | Qty: 30 | Fill #0

## 2016-04-05 MED FILL — ?LISINOPRIL 20 MG TABLET: 20 | 30 days supply | Qty: 30 | Fill #0

## 2016-04-05 MED FILL — NAPROXEN 500 MG TABLET: 500 | 15 days supply | Qty: 30 | Fill #0

## 2016-04-05 MED FILL — ?OMEPRAZOLE DR 20 MG CAPSUL: 20 | 30 days supply | Qty: 30 | Fill #0

## 2016-04-05 MED FILL — predniSONE 20 MG TABS: 20 | 5 days supply | Qty: 10 | Fill #0

## 2016-04-05 NOTE — Progress Notes (Signed)
Subjective:  Patient ID: Colin Warner, male    DOB: [birth year]  Age: 56 y.o. MRN: [id number]  CC: Hypertension; Edema (knees and left elbow); Nasal Congestion; and Cough   HPI Colin Warner is a 56 year old male with a history of hypertension, hyperlipidemia, upper GI bleed who presents today for a follow-up visit.  He endorses running out of his antihypertensives hence elevated blood pressure. He denies chest pain or shortness of breath.  He remains on his PPI and denies any hematemesis, hematochezia or abdominal pain.  He complains of left knee pain and swelling for the last one week which is worse with the cold weather and denies any history of trauma. He has had arthrocentesis of his left knee joint in the past.  Past Medical History:  Diagnosis Date  . Hypertension     Past Surgical History:  Procedure Laterality Date  . ESOPHAGOGASTRODUODENOSCOPY N/A 12/14/2015   Procedure: ESOPHAGOGASTRODUODENOSCOPY (EGD);  Surgeon: Wonda Horner, MD;  Location: [facility] ENDOSCOPY;  Service: Endoscopy;  Laterality: N/A;  . Urologic surgery, unknown, for hematuria       No Known Allergies   Outpatient Medications Prior to Visit  Medication Sig Dispense Refill  . cyclobenzaprine (FLEXERIL) 10 MG tablet Take 1 tablet (10 mg total) by mouth at bedtime. 30 tablet 0  . lisinopril (PRINIVIL,ZESTRIL) 20 MG tablet Take 1 tablet (20 mg total) by mouth daily. 30 tablet 3  . omeprazole (PRILOSEC) 20 MG capsule Take 1 capsule (20 mg total) by mouth 2 (two) times daily before a meal. For 1 month, then once daily 60 capsule 1  . simvastatin (ZOCOR) 20 MG tablet Take 1 tablet (20 mg total) by mouth at bedtime. 30 tablet 3   No facility-administered medications prior to visit.     ROS Review of Systems  Constitutional: Negative for activity change and appetite change.  HENT: Negative for sinus pressure and sore throat.   Eyes: Negative for visual disturbance.  Respiratory: Negative for cough, chest  tightness and shortness of breath.   Cardiovascular: Negative for chest pain and leg swelling.  Gastrointestinal: Negative for abdominal distention, abdominal pain, constipation and diarrhea.  Endocrine: Negative.   Genitourinary: Negative for dysuria.  Musculoskeletal: Positive for arthralgias and joint swelling. Negative for myalgias.  Skin: Negative for rash.  Allergic/Immunologic: Negative.   Neurological: Negative for weakness, light-headedness and numbness.  Psychiatric/Behavioral: Negative for dysphoric mood and suicidal ideas.    Objective:  BP (!) 165/94 (BP Location: Right Arm, Patient Position: Sitting, Cuff Size: Small)   Pulse 88   Temp 98.6 F (37 C) (Oral)   Ht 5\' 5"  (1.651 m)   Wt 142 lb 9.6 oz (64.7 kg)   SpO2 98%   BMI 23.73 kg/m   BP/Weight 04/05/2016 02/17/2016 [id number]  Systolic BP 123XX123 [id number]  Diastolic BP 94 90 85  Wt. (Lbs) 142.6 139.6 131.4  BMI 23.73 23.23 21.21      Physical Exam  Constitutional: He is oriented to person, place, and time. He appears well-developed and well-nourished.  Cardiovascular: Normal rate, normal heart sounds and intact distal pulses.   No murmur heard. Pulmonary/Chest: Effort normal and breath sounds normal. He has no wheezes. He has no rales. He exhibits no tenderness.  Abdominal: Soft. Bowel sounds are normal. He exhibits no distension and no mass. There is no tenderness.  Musculoskeletal: Normal range of motion. He exhibits edema (in superomedial aspect of left knee) and tenderness (Mild tenderness on range of motion).  Neurological: He is  alert and oriented to person, place, and time.     Assessment & Plan:   1. Acute pain of left knee With associated effusion If symptoms do not improve he will need possible arthrocentesis of left knee - predniSONE (DELTASONE) 20 MG tablet; Take 1 tablet (20 mg total) by mouth 2 (two) times daily with a meal.  Dispense: 10 tablet; Refill: 0 - naproxen (NAPROSYN) 500 MG tablet;  Take 1 tablet (500 mg total) by mouth 2 (two) times daily with a meal.  Dispense: 30 tablet; Refill: 2  2. Essential hypertension Uncontrolled due to running out of medications which I have refilled Low-sodium diet - COMPLETE METABOLIC PANEL WITH GFR; Future - Lipid Panel w/reflex Direct LDL; Future - lisinopril (PRINIVIL,ZESTRIL) 20 MG tablet; Take 1 tablet (20 mg total) by mouth daily.  Dispense: 30 tablet; Refill: 3 - simvastatin (ZOCOR) 20 MG tablet; Take 1 tablet (20 mg total) by mouth at bedtime.  Dispense: 30 tablet; Refill: 3  3. Acute upper GI bleed Resolved - omeprazole (PRILOSEC) 20 MG capsule; Take 1 capsule (20 mg total) by mouth daily. For 1 month, then once daily  Dispense: 30 capsule; Refill: 3  4. Pure hypercholesterolemia Continue simvastatin Lipid panel ordered   Meds ordered this encounter  Medications  . predniSONE (DELTASONE) 20 MG tablet    Sig: Take 1 tablet (20 mg total) by mouth 2 (two) times daily with a meal.    Dispense:  10 tablet    Refill:  0  . naproxen (NAPROSYN) 500 MG tablet    Sig: Take 1 tablet (500 mg total) by mouth 2 (two) times daily with a meal.    Dispense:  30 tablet    Refill:  2  . lisinopril (PRINIVIL,ZESTRIL) 20 MG tablet    Sig: Take 1 tablet (20 mg total) by mouth daily.    Dispense:  30 tablet    Refill:  3  . omeprazole (PRILOSEC) 20 MG capsule    Sig: Take 1 capsule (20 mg total) by mouth daily. For 1 month, then once daily    Dispense:  30 capsule    Refill:  3  . simvastatin (ZOCOR) 20 MG tablet    Sig: Take 1 tablet (20 mg total) by mouth at bedtime.    Dispense:  30 tablet    Refill:  3    Follow-up: Return in about 1 month (around 05/06/2016) for Follow-up on hypertension and left knee pain.   Arnoldo Morale MD

## 2016-04-05 NOTE — Progress Notes (Signed)
Med refills- out of a couple of them a week ago

## 2016-04-05 NOTE — Patient Instructions (Signed)

## 2016-04-07 ENCOUNTER — Ambulatory Visit: Payer: Self-pay | Attending: Family Medicine

## 2016-04-07 DIAGNOSIS — I10 Essential (primary) hypertension: Secondary | ICD-10-CM | POA: Insufficient documentation

## 2016-04-07 LAB — COMPLETE METABOLIC PANEL WITH GFR
ALK PHOS: 92 U/L (ref 40–115)
ALT: 5 U/L — AB (ref 9–46)
AST: 12 U/L (ref 10–35)
Albumin: 3.6 g/dL (ref 3.6–5.1)
BILIRUBIN TOTAL: 0.6 mg/dL (ref 0.2–1.2)
BUN: 14 mg/dL (ref 7–25)
CO2: 28 mmol/L (ref 20–31)
CREATININE: 0.99 mg/dL (ref 0.70–1.33)
Calcium: 9.4 mg/dL (ref 8.6–10.3)
Chloride: 103 mmol/L (ref 98–110)
GFR, EST NON AFRICAN AMERICAN: 85 mL/min (ref >=60)
Glucose, Bld: 91 mg/dL (ref 65–99)
Potassium: 4 mmol/L (ref 3.5–5.3)
Sodium: 139 mmol/L (ref 135–146)
TOTAL PROTEIN: 7.5 g/dL (ref 6.1–8.1)

## 2016-04-07 LAB — LIPID PANEL W/REFLEX DIRECT LDL
CHOLESTEROL: 154 mg/dL (ref <200)
HDL: 77 mg/dL (ref >40)
LDL-CHOLESTEROL: 62 mg/dL
Non-HDL Cholesterol (Calc): 77 mg/dL (ref <130)
TRIGLYCERIDES: 66 mg/dL (ref <150)
Total CHOL/HDL Ratio: 2 Ratio (ref <5.0)

## 2016-04-07 NOTE — Progress Notes (Signed)
Patient here for lab visit only 

## 2016-04-26 ENCOUNTER — Telehealth: Payer: Self-pay

## 2016-04-26 NOTE — Telephone Encounter (Signed)
-----   Message from Arnoldo Morale, MD sent at 04/08/2016 12:15 PM EST ----- Please inform the patient that labs are normal. Thank you.

## 2016-04-26 NOTE — Telephone Encounter (Signed)
Spoke with patient's wife regarding labs who stated an understanding of husbands results and will inform him of these results.

## 2016-05-23 MED FILL — ?SIMVASTATIN 20 MG TABLET: 20 MG | 30 days supply | Qty: 30 | Fill #1

## 2016-05-23 MED FILL — NAPROXEN 500 MG TABLET: 500 | 15 days supply | Qty: 30 | Fill #1

## 2016-05-23 MED FILL — ?OMEPRAZOLE DR 20 MG CAPSUL: 20 | 30 days supply | Qty: 30 | Fill #1

## 2016-06-29 MED FILL — LISINOPRIL 20 MG TABLET: 20 | 30 days supply | Qty: 30 | Fill #1

## 2016-12-29 MED FILL — ?OMEPRAZOLE DR 20 MG CAPSUL: 20 | 30 days supply | Qty: 30 | Fill #2

## 2016-12-29 MED FILL — LISINOPRIL 20 MG TABLET: 20 | 30 days supply | Qty: 30 | Fill #2

## 2016-12-29 MED FILL — NAPROXEN 500 MG TABLET: 500 | 15 days supply | Qty: 30 | Fill #2

## 2016-12-29 MED FILL — SIMVASTATIN 20 MG TABLET: 20 | 30 days supply | Qty: 30 | Fill #2

## 2017-06-28 ENCOUNTER — Other Ambulatory Visit: Payer: Self-pay | Admitting: Family Medicine

## 2017-06-28 DIAGNOSIS — I10 Essential (primary) hypertension: Secondary | ICD-10-CM

## 2017-06-28 DIAGNOSIS — M25562 Pain in left knee: Secondary | ICD-10-CM

## 2017-08-04 ENCOUNTER — Ambulatory Visit: Payer: Self-pay | Attending: Family Medicine | Admitting: Family Medicine

## 2017-08-04 ENCOUNTER — Encounter: Payer: Self-pay | Admitting: Family Medicine

## 2017-08-04 DIAGNOSIS — K2950 Unspecified chronic gastritis without bleeding: Secondary | ICD-10-CM | POA: Insufficient documentation

## 2017-08-04 DIAGNOSIS — E7800 Pure hypercholesterolemia, unspecified: Secondary | ICD-10-CM

## 2017-08-04 DIAGNOSIS — Z79899 Other long term (current) drug therapy: Secondary | ICD-10-CM | POA: Insufficient documentation

## 2017-08-04 DIAGNOSIS — E785 Hyperlipidemia, unspecified: Secondary | ICD-10-CM | POA: Insufficient documentation

## 2017-08-04 DIAGNOSIS — M25561 Pain in right knee: Secondary | ICD-10-CM | POA: Insufficient documentation

## 2017-08-04 DIAGNOSIS — M25562 Pain in left knee: Secondary | ICD-10-CM | POA: Insufficient documentation

## 2017-08-04 DIAGNOSIS — I10 Essential (primary) hypertension: Secondary | ICD-10-CM | POA: Insufficient documentation

## 2017-08-04 DIAGNOSIS — G8929 Other chronic pain: Secondary | ICD-10-CM | POA: Insufficient documentation

## 2017-08-04 MED ORDER — LISINOPRIL 20 MG PO TABS: 20.0000 mg | ORAL_TABLET | Freq: Every day | ORAL | 6 refills | Status: DC

## 2017-08-04 MED ORDER — NAPROXEN 500 MG PO TABS: 500.0000 mg | ORAL_TABLET | Freq: Two times a day (BID) | ORAL | 2 refills | Status: DC

## 2017-08-04 MED ORDER — PREDNISONE 20 MG PO TABS: 20.0000 mg | ORAL_TABLET | Freq: Two times a day (BID) | ORAL | 0 refills | Status: DC

## 2017-08-04 MED ORDER — OMEPRAZOLE 20 MG PO CPDR: 20.0000 mg | DELAYED_RELEASE_CAPSULE | Freq: Every day | ORAL | 6 refills | Status: DC

## 2017-08-04 MED ORDER — SIMVASTATIN 20 MG PO TABS: 20.0000 mg | ORAL_TABLET | Freq: Every day | ORAL | 6 refills | Status: DC

## 2017-08-04 MED FILL — predniSONE 20 MG TABS: 20 | 5 days supply | Qty: 10 | Fill #0

## 2017-08-04 MED FILL — OMEPRAZOLE 20 MG CAP: 20 | 30 days supply | Qty: 30 | Fill #0

## 2017-08-04 MED FILL — SIMVASTATIN 20 MG TABLET: 20 | 30 days supply | Qty: 30 | Fill #0

## 2017-08-04 MED FILL — LISINOPRIL 20 MG TAB: 20 | 30 days supply | Qty: 30 | Fill #0

## 2017-08-04 MED FILL — NAPROXEN 500 MG TABLET: 500 | 15 days supply | Qty: 30 | Fill #0

## 2017-08-04 NOTE — Progress Notes (Signed)
Subjective:  Patient ID: Colin Warner, male    DOB: [birth year]  Age: 57 y.o. MRN: [id number]  CC: Hypertension   HPI Colin Warner is a 57 year old male with a history of hypertension, hyperlipidemia,previous  upper GI bleed who presents today for a follow-up visit. He complains of severe chronic pain in his left knee with associated swelling and now has begun to have right knee pain as well.  He has run out of naproxen and has nothing for pain. His blood pressure is significantly elevated and he has been out of his antihypertensives for the last 3 weeks and is requesting refills. He has also been out of his statin and is requesting refills. Denies chest pain, shortness of breath, blurry vision.  Past Medical History:  Diagnosis Date  . Hypertension     Past Surgical History:  Procedure Laterality Date  . ESOPHAGOGASTRODUODENOSCOPY N/A 12/14/2015   Procedure: ESOPHAGOGASTRODUODENOSCOPY (EGD);  Surgeon: Wonda Horner, MD;  Location: [facility] ENDOSCOPY;  Service: Endoscopy;  Laterality: N/A;  . Urologic surgery, unknown, for hematuria       No Known Allergies   Outpatient Medications Prior to Visit  Medication Sig Dispense Refill  . lisinopril (PRINIVIL,ZESTRIL) 20 MG tablet Take 1 tablet (20 mg total) by mouth daily. 30 tablet 3  . naproxen (NAPROSYN) 500 MG tablet Take 1 tablet (500 mg total) by mouth 2 (two) times daily with a meal. 30 tablet 2  . omeprazole (PRILOSEC) 20 MG capsule Take 1 capsule (20 mg total) by mouth daily. For 1 month, then once daily 30 capsule 3  . simvastatin (ZOCOR) 20 MG tablet Take 1 tablet (20 mg total) by mouth at bedtime. 30 tablet 3  . cetirizine (ZYRTEC) 10 MG tablet Take 1 tablet (10 mg total) by mouth daily. (Patient not taking: Reported on 08/04/2017) 30 tablet 1  . cyclobenzaprine (FLEXERIL) 10 MG tablet Take 1 tablet (10 mg total) by mouth at bedtime. (Patient not taking: Reported on 08/04/2017) 30 tablet 0  . predniSONE (DELTASONE) 20 MG tablet  Take 1 tablet (20 mg total) by mouth 2 (two) times daily with a meal. (Patient not taking: Reported on 08/04/2017) 10 tablet 0   No facility-administered medications prior to visit.     ROS Review of Systems  Constitutional: Negative for activity change and appetite change.  HENT: Negative for sinus pressure and sore throat.   Eyes: Negative for visual disturbance.  Respiratory: Negative for cough, chest tightness and shortness of breath.   Cardiovascular: Negative for chest pain and leg swelling.  Gastrointestinal: Negative for abdominal distention, abdominal pain, constipation and diarrhea.  Endocrine: Negative.   Genitourinary: Negative for dysuria.  Musculoskeletal:       See hpi  Skin: Negative for rash.  Allergic/Immunologic: Negative.   Neurological: Negative for weakness, light-headedness and numbness.  Psychiatric/Behavioral: Negative for dysphoric mood and suicidal ideas.    Objective:  BP (!) 199/98   Pulse 67   Temp 98.2 F (36.8 C) (Oral)   Wt 142 lb 12.8 oz (64.8 kg)   SpO2 99%   BMI 23.76 kg/m   BP/Weight 08/04/2017 04/05/2016 98/26/4158  Systolic BP 309 407 680  Diastolic BP 98 94 90  Wt. (Lbs) 142.8 142.6 139.6  BMI 23.76 23.73 23.23      Physical Exam  Constitutional: He is oriented to person, place, and time. He appears well-developed and well-nourished.  Cardiovascular: Normal rate, normal heart sounds and intact distal pulses.  No murmur heard. Pulmonary/Chest: Effort normal and  breath sounds normal. He has no wheezes. He has no rales. He exhibits no tenderness.  Abdominal: Soft. Bowel sounds are normal. He exhibits no distension and no mass. There is no tenderness.  Musculoskeletal:  Left knee edema and tenderness superior lateral aspect No right knee tenderness or edema. Crepitus associated on range of motion bilaterally  Neurological: He is alert and oriented to person, place, and time.  Skin: Skin is warm and dry.  Psychiatric: He has a  normal mood and affect.     Assessment & Plan:   1. Essential hypertension Accelerated blood pressure Unable to administer clonidine due to bradycardia - lisinopril (PRINIVIL,ZESTRIL) 20 MG tablet; Take 1 tablet (20 mg total) by mouth daily.  Dispense: 30 tablet; Refill: 6 - simvastatin (ZOCOR) 20 MG tablet; Take 1 tablet (20 mg total) by mouth at bedtime.  Dispense: 30 tablet; Refill: 6 - CMP14+EGFR  2. Chronic pain of both knees Likely left knee effusion-might need arthrocentesis Advised that if symptoms persist he might need to present to the ED - DG Knee Complete 4 Views Left; Future - DG Knee Complete 4 Views Right; Future - naproxen (NAPROSYN) 500 MG tablet; Take 1 tablet (500 mg total) by mouth 2 (two) times daily with a meal.  Dispense: 30 tablet; Refill: 2 - predniSONE (DELTASONE) 20 MG tablet; Take 1 tablet (20 mg total) by mouth 2 (two) times daily with a meal.  Dispense: 10 tablet; Refill: 0  3. Chronic gastritis Stable - omeprazole (PRILOSEC) 20 MG capsule; Take 1 capsule (20 mg total) by mouth daily.  Dispense: 30 capsule; Refill: 6  4.  Hyperlipidemia Controlled Continue statin  Meds ordered this encounter  Medications  . lisinopril (PRINIVIL,ZESTRIL) 20 MG tablet    Sig: Take 1 tablet (20 mg total) by mouth daily.    Dispense:  30 tablet    Refill:  6  . naproxen (NAPROSYN) 500 MG tablet    Sig: Take 1 tablet (500 mg total) by mouth 2 (two) times daily with a meal.    Dispense:  30 tablet    Refill:  2  . omeprazole (PRILOSEC) 20 MG capsule    Sig: Take 1 capsule (20 mg total) by mouth daily.    Dispense:  30 capsule    Refill:  6  . predniSONE (DELTASONE) 20 MG tablet    Sig: Take 1 tablet (20 mg total) by mouth 2 (two) times daily with a meal.    Dispense:  10 tablet    Refill:  0  . simvastatin (ZOCOR) 20 MG tablet    Sig: Take 1 tablet (20 mg total) by mouth at bedtime.    Dispense:  30 tablet    Refill:  6    Follow-up: Return in about 1 month  (around 09/04/2017) for Follow-up of knee pain.   Charlott Rakes MD

## 2017-08-04 NOTE — Progress Notes (Signed)
Patient has been out of BP medication.

## 2017-08-04 NOTE — Patient Instructions (Signed)

## 2017-08-05 LAB — CMP14+EGFR
A/G RATIO: 1.2 (ref 1.2–2.2)
ALK PHOS: 93 IU/L (ref 39–117)
ALT: 13 IU/L (ref 0–44)
AST: 20 IU/L (ref 0–40)
Albumin: 4.2 g/dL (ref 3.5–5.5)
BUN/Creatinine Ratio: 11 (ref 9–20)
BUN: 13 mg/dL (ref 6–24)
Bilirubin Total: 0.6 mg/dL (ref 0.0–1.2)
CO2: 24 mmol/L (ref 20–29)
CREATININE: 1.14 mg/dL (ref 0.76–1.27)
Calcium: 9.5 mg/dL (ref 8.7–10.2)
Chloride: 100 mmol/L (ref 96–106)
GFR calc Af Amer: 83 mL/min/{1.73_m2} (ref >59)
GFR calc non Af Amer: 71 mL/min/{1.73_m2} (ref >59)
GLOBULIN, TOTAL: 3.5 g/dL (ref 1.5–4.5)
Glucose: 94 mg/dL (ref 65–99)
POTASSIUM: 4.3 mmol/L (ref 3.5–5.2)
SODIUM: 140 mmol/L (ref 134–144)
Total Protein: 7.7 g/dL (ref 6.0–8.5)

## 2017-08-10 ENCOUNTER — Telehealth: Payer: Self-pay

## 2017-08-10 NOTE — Telephone Encounter (Signed)
Patient was called and there is no voicemail set up to leave a message.   If patient calls back please inform patient of results below.

## 2017-08-10 NOTE — Telephone Encounter (Signed)
-----   Message from Charlott Rakes, MD sent at 08/07/2017  1:26 PM EDT ----- Please inform the patient that labs are normal. Thank you.

## 2017-08-22 ENCOUNTER — Other Ambulatory Visit: Payer: Self-pay | Admitting: Family Medicine

## 2017-08-22 DIAGNOSIS — M25561 Pain in right knee: Principal | ICD-10-CM

## 2017-08-22 DIAGNOSIS — G8929 Other chronic pain: Secondary | ICD-10-CM

## 2017-08-22 DIAGNOSIS — M25562 Pain in left knee: Principal | ICD-10-CM

## 2017-09-25 ENCOUNTER — Ambulatory Visit: Payer: Self-pay | Attending: Family Medicine | Admitting: Family Medicine

## 2017-09-25 ENCOUNTER — Encounter: Payer: Self-pay | Admitting: Family Medicine

## 2017-09-25 VITALS — BP 151/95 | HR 72 | Temp 97.9°F | Wt 141.6 lb

## 2017-09-25 DIAGNOSIS — Z9889 Other specified postprocedural states: Secondary | ICD-10-CM | POA: Insufficient documentation

## 2017-09-25 DIAGNOSIS — G8929 Other chronic pain: Secondary | ICD-10-CM

## 2017-09-25 DIAGNOSIS — Z791 Long term (current) use of non-steroidal anti-inflammatories (NSAID): Secondary | ICD-10-CM | POA: Insufficient documentation

## 2017-09-25 DIAGNOSIS — I10 Essential (primary) hypertension: Secondary | ICD-10-CM | POA: Insufficient documentation

## 2017-09-25 DIAGNOSIS — M25562 Pain in left knee: Secondary | ICD-10-CM | POA: Insufficient documentation

## 2017-09-25 DIAGNOSIS — E785 Hyperlipidemia, unspecified: Secondary | ICD-10-CM | POA: Insufficient documentation

## 2017-09-25 DIAGNOSIS — Z79899 Other long term (current) drug therapy: Secondary | ICD-10-CM | POA: Insufficient documentation

## 2017-09-25 DIAGNOSIS — M7989 Other specified soft tissue disorders: Secondary | ICD-10-CM | POA: Insufficient documentation

## 2017-09-25 DIAGNOSIS — M25561 Pain in right knee: Secondary | ICD-10-CM | POA: Insufficient documentation

## 2017-09-25 DIAGNOSIS — Z7952 Long term (current) use of systemic steroids: Secondary | ICD-10-CM | POA: Insufficient documentation

## 2017-09-25 MED ORDER — LISINOPRIL-HYDROCHLOROTHIAZIDE 20-25 MG PO TABS: 1.0000 | ORAL_TABLET | Freq: Every day | ORAL | 6 refills | Status: DC

## 2017-09-25 NOTE — Progress Notes (Signed)
Subjective:  Patient ID: Colin Warner, male    DOB: 02/28/1961  Age: 57 y.o. MRN: [id number]  CC: Hypertension   HPI Colin Warner is a 57 year old male with a history of hypertension, hyperlipidemia, chronic bilateral knee pain who presents today for follow-up of left knee pain.  He was placed on prednisone at his last office visit and referred for a knee x-ray but was never able to undergo the x-ray. His left knee swelling has improved and pain is described as a 6-7/10.  He works 12 to 15-hour shifts and is on his feet at work all day; denies right knee symptoms.   He continues to take naproxen for his knee pain. His blood pressure is slightly elevated) taking his lisinopril.  He complains of being stressed as his wife was recently discharged from the hospital.  Past Medical History:  Diagnosis Date  . Hypertension     Past Surgical History:  Procedure Laterality Date  . ESOPHAGOGASTRODUODENOSCOPY N/A 12/14/2015   Procedure: ESOPHAGOGASTRODUODENOSCOPY (EGD);  Surgeon: Wonda Horner, MD;  Location: [facility] ENDOSCOPY;  Service: Endoscopy;  Laterality: N/A;  . Urologic surgery, unknown, for hematuria       No Known Allergies   Outpatient Medications Prior to Visit  Medication Sig Dispense Refill  . simvastatin (ZOCOR) 20 MG tablet Take 1 tablet (20 mg total) by mouth at bedtime. 30 tablet 6  . lisinopril (PRINIVIL,ZESTRIL) 20 MG tablet Take 1 tablet (20 mg total) by mouth daily. 30 tablet 6  . naproxen (NAPROSYN) 500 MG tablet Take 1 tablet (500 mg total) by mouth 2 (two) times daily with a meal. (Patient not taking: Reported on 09/25/2017) 30 tablet 2  . omeprazole (PRILOSEC) 20 MG capsule Take 1 capsule (20 mg total) by mouth daily. (Patient not taking: Reported on 09/25/2017) 30 capsule 6  . predniSONE (DELTASONE) 20 MG tablet Take 1 tablet (20 mg total) by mouth 2 (two) times daily with a meal. (Patient not taking: Reported on 09/25/2017) 10 tablet 0   No facility-administered  medications prior to visit.     ROS Review of Systems  Constitutional: Negative for activity change and appetite change.  HENT: Negative for sinus pressure and sore throat.   Eyes: Negative for visual disturbance.  Respiratory: Negative for cough, chest tightness and shortness of breath.   Cardiovascular: Negative for chest pain and leg swelling.  Gastrointestinal: Negative for abdominal distention, abdominal pain, constipation and diarrhea.  Endocrine: Negative.   Genitourinary: Negative for dysuria.  Musculoskeletal:       See hpi  Skin: Negative for rash.  Allergic/Immunologic: Negative.   Neurological: Negative for weakness, light-headedness and numbness.  Psychiatric/Behavioral: Negative for dysphoric mood and suicidal ideas.    Objective:  BP (!) 151/95   Pulse 72   Temp 97.9 F (36.6 C) (Oral)   Wt 141 lb 9.6 oz (64.2 kg)   SpO2 100%   BMI 23.56 kg/m   BP/Weight 09/25/2017 1/96/2229 10/04/8919  Systolic BP 194 174 081  Diastolic BP 95 98 94  Wt. (Lbs) 141.6 142.8 142.6  BMI 23.56 23.76 23.73      Physical Exam  Constitutional: He is oriented to person, place, and time. He appears well-developed and well-nourished.  Cardiovascular: Normal rate, normal heart sounds and intact distal pulses.  No murmur heard. Pulmonary/Chest: Effort normal and breath sounds normal. He has no wheezes. He has no rales. He exhibits no tenderness.  Abdominal: Soft. Bowel sounds are normal. He exhibits no distension and no mass.  There is no tenderness.  Musculoskeletal: Normal range of motion. He exhibits edema (slight edema of superolateral aspect of left knee. Tenderness on flexion, - extension. R knee is normal).  Neurological: He is alert and oriented to person, place, and time.     Assessment & Plan:   1. Essential hypertension Uncontrolled Lisinopril switched to lisinopril/HCTZ Counseled on blood pressure goal of less than 130/80, low-sodium, DASH diet, medication compliance,  150 minutes of moderate intensity exercise per week. Discussed medication compliance, adverse effects. - lisinopril-hydrochlorothiazide (PRINZIDE,ZESTORETIC) 20-25 MG tablet; Take 1 tablet by mouth daily.  Dispense: 30 tablet; Refill: 6  2. Chronic pain of left knee Improved  Continue naproxen Advised to use knee brace Advised to undergo x-ray which was previously ordered   Meds ordered this encounter  Medications  . lisinopril-hydrochlorothiazide (PRINZIDE,ZESTORETIC) 20-25 MG tablet    Sig: Take 1 tablet by mouth daily.    Dispense:  30 tablet    Refill:  6    D/c Lisinopril    Follow-up: Return in about 3 months (around 12/26/2017) for Follow-up of chronic medical conditions.   Charlott Rakes MD

## 2017-09-25 NOTE — Patient Instructions (Signed)

## 2017-09-26 MED FILL — LISINOPRIL-HCTZ 20-25 MG TA: 20-25 | 30 days supply | Qty: 30 | Fill #0

## 2017-09-28 ENCOUNTER — Other Ambulatory Visit: Payer: Self-pay

## 2017-09-28 ENCOUNTER — Emergency Department (HOSPITAL_COMMUNITY)
Admission: EM | Admit: 2017-09-28 | Discharge: 2017-09-28 | Disposition: A | Discharge status: Home / Self Care | Payer: Self-pay | Attending: Emergency Medicine | Admitting: Emergency Medicine

## 2017-09-28 ENCOUNTER — Emergency Department (HOSPITAL_COMMUNITY): Payer: Self-pay

## 2017-09-28 ENCOUNTER — Encounter (HOSPITAL_COMMUNITY): Payer: Self-pay | Admitting: Emergency Medicine

## 2017-09-28 DIAGNOSIS — I10 Essential (primary) hypertension: Secondary | ICD-10-CM | POA: Insufficient documentation

## 2017-09-28 DIAGNOSIS — F17210 Nicotine dependence, cigarettes, uncomplicated: Secondary | ICD-10-CM | POA: Insufficient documentation

## 2017-09-28 DIAGNOSIS — Z79899 Other long term (current) drug therapy: Secondary | ICD-10-CM | POA: Insufficient documentation

## 2017-09-28 DIAGNOSIS — M25562 Pain in left knee: Secondary | ICD-10-CM | POA: Insufficient documentation

## 2017-09-28 DIAGNOSIS — G8929 Other chronic pain: Secondary | ICD-10-CM | POA: Insufficient documentation

## 2017-09-28 NOTE — ED Provider Notes (Signed)
[facility] EMERGENCY DEPARTMENT Provider Note   CSN: [id number] Arrival date & time: 09/28/17  1117     History   Chief Complaint Chief Complaint  Patient presents with  . Knee Pain    HPI Colin Warner is a 57 y.o. male.  HPI   Colin Warner is a 57 y.o. male, with a history of HTN and chronic knee pain, presenting to the ED with left knee pain and swelling over the last 3 months.  States that has been swelling on and off for the last 10 years.  Patient was evaluated by his PCP, x-ray was ordered, but patient has not yet taken the time to complete the x-ray.  Therefore, his PCP sent him to the ED "for an x-ray and may be to drain the knee."  Denies fever/chills, nausea/vomiting, numbness, weakness, acute injury, or any other complaints.        Past Medical History:  Diagnosis Date  . Hypertension     Patient Active Problem List   Diagnosis Date Noted  . Hyperlipidemia 08/04/2017  . Acute upper GI bleed   . GI bleed 12/13/2015  . Essential hypertension 12/13/2015  . Acute blood loss anemia 12/13/2015  . Left knee pain 12/13/2015  . Alcohol use disorder, moderate, dependence ([place]) 12/13/2015    Past Surgical History:  Procedure Laterality Date  . ESOPHAGOGASTRODUODENOSCOPY N/A 12/14/2015   Procedure: ESOPHAGOGASTRODUODENOSCOPY (EGD);  Surgeon: Wonda Horner, MD;  Location: [facility] ENDOSCOPY;  Service: Endoscopy;  Laterality: N/A;  . Urologic surgery, unknown, for hematuria           Home Medications    Prior to Admission medications   Medication Sig Start Date End Date Taking? Authorizing Provider  lisinopril-hydrochlorothiazide (PRINZIDE,ZESTORETIC) 20-25 MG tablet Take 1 tablet by mouth daily. 09/25/17   Charlott Rakes, MD  naproxen (NAPROSYN) 500 MG tablet Take 1 tablet (500 mg total) by mouth 2 (two) times daily with a meal. Patient not taking: Reported on 09/25/2017 08/04/17   Charlott Rakes, MD  omeprazole (PRILOSEC) 20 MG capsule Take 1  capsule (20 mg total) by mouth daily. Patient not taking: Reported on 09/25/2017 08/04/17   Charlott Rakes, MD  predniSONE (DELTASONE) 20 MG tablet Take 1 tablet (20 mg total) by mouth 2 (two) times daily with a meal. Patient not taking: Reported on 09/25/2017 08/04/17   Charlott Rakes, MD  simvastatin (ZOCOR) 20 MG tablet Take 1 tablet (20 mg total) by mouth at bedtime. 08/04/17   Charlott Rakes, MD    Family History Family History  Problem Relation Age of Onset  . Hypertension Mother   . Chronic Renal Failure Mother   . Alcoholism Father     Social History Social History   Tobacco Use  . Smoking status: Current Every Day Smoker    Packs/day: 0.50    Types: Cigarettes  . Smokeless tobacco: Never Used  . Tobacco comment: 5-6 daily  Substance Use Topics  . Alcohol use: Yes    Comment: 2 months ago  . Drug use: No     Allergies   Patient has no known allergies.   Review of Systems Review of Systems  Constitutional: Negative for fever.  Musculoskeletal: Positive for arthralgias and joint swelling.  Skin: Negative for color change.  Neurological: Negative for weakness and numbness.     Physical Exam Updated Vital Signs BP (!) 179/114   Pulse 78   Temp 98.6 F (37 C) (Oral)   Resp 18   Ht 5'  5" (1.651 m)   Wt 64 kg (141 lb)   SpO2 100%   BMI 23.46 kg/m   Physical Exam  Constitutional: He appears well-developed and well-nourished. No distress.  HENT:  Head: Normocephalic and atraumatic.  Eyes: Conjunctivae are normal.  Neck: Neck supple.  Cardiovascular: Normal rate, regular rhythm and intact distal pulses.  Pulmonary/Chest: Effort normal.  Musculoskeletal: He exhibits edema and tenderness.  Some edema consistent with possible effusion located mostly on the superior medial section of the left knee.  Tenderness to the medial left knee.  No noted laxity, erythema, increased warmth, or deformity.  Full range of motion without pain.  Neurological: He is alert.    Sensation to light touch grossly intact in the lower extremities. Strength 5/5 with flexion and extension of the left knee. Ambulatory without assistance.  Skin: Skin is warm and dry. He is not diaphoretic. No pallor.  Psychiatric: He has a normal mood and affect. His behavior is normal.  Nursing note and vitals reviewed.    ED Treatments / Results  Labs (all labs ordered are listed, but only abnormal results are displayed) Labs Reviewed - No data to display  EKG None  Radiology Dg Knee Complete 4 Views Left  Result Date: 09/28/2017 CLINICAL DATA:  Chronic left knee pain, no known injury, initial encounter EXAM: LEFT KNEE - COMPLETE 4+ VIEW COMPARISON:  12/13/2015 FINDINGS: Degenerative changes are noted without acute fracture or dislocation. Small joint effusion is noted. No other focal abnormality is noted. IMPRESSION: Mild degenerative change with small joint effusion. No acute bony abnormality is seen. Electronically Signed   By: Inez Catalina M.D.   On: 09/28/2017 12:01    Procedures Procedures (including critical care time)  Medications Ordered in ED Medications - No data to display   Initial Impression / Assessment and Plan / ED Course  I have reviewed the triage vital signs and the nursing notes.  Pertinent labs & imaging results that were available during my care of the patient were reviewed by me and considered in my medical decision making (see chart for details).  Clinical Course as of Sep 29 1343  Thu Sep 28, 2017  1140 Patient states his PCP is managing his blood pressure, however, he did not take his blood pressure medication this morning. Denies CP, dizziness, SOB, headache, neuro deficits, or any other related complaints.   BP(!): 179/114 [SJ]    Clinical Course User Index [SJ] Joy, Shawn C, PA-C    Patient presents with chronic left knee pain with mild edema.  Low suspicion for septic joint due to duration, lack of risk factors, and exam is not  suggestive.  Mild joint effusion on x-ray with chronic degenerative changes.  I think the risks of arthrocentesis in the ED with the patient's presentation outweigh the benefits at this time.  Orthopedic follow-up. The patient was given instructions for home care as well as return precautions. Patient voices understanding of these instructions, accepts the plan, and is comfortable with discharge.    Final Clinical Impressions(s) / ED Diagnoses   Final diagnoses:  Chronic pain of left knee    ED Discharge Orders    None       Layla Maw 09/28/17 1347    Quintella Reichert, MD 09/29/17 1536

## 2017-09-28 NOTE — Discharge Instructions (Signed)
°  May use anti-inflammatory medications, such as naproxen or ibuprofen. Keep the knee elevated whenever possible reduce swelling. Compression with a knee brace or Ace wrap will likely help with swelling as well and give the knee support. Follow-up with orthopedic specialist within the next couple weeks.  Call the number provided to set up an appointment.

## 2017-09-28 NOTE — ED Triage Notes (Signed)
Pt. Stated, [facility] get [facility] drained off knee. Pain for over a year.

## 2017-10-02 ENCOUNTER — Other Ambulatory Visit: Payer: Self-pay | Admitting: Family Medicine

## 2017-10-02 DIAGNOSIS — M25562 Pain in left knee: Principal | ICD-10-CM

## 2017-10-02 DIAGNOSIS — M25561 Pain in right knee: Principal | ICD-10-CM

## 2017-10-02 DIAGNOSIS — G8929 Other chronic pain: Secondary | ICD-10-CM

## 2017-10-02 MED FILL — OMEPRAZOLE 20 MG CAP: 20 | 30 days supply | Qty: 30 | Fill #1

## 2017-10-02 MED FILL — SIMVASTATIN 20 MG TABLET: 20 | 30 days supply | Qty: 30 | Fill #1

## 2017-10-02 MED FILL — NAPROXEN 500 MG TABLET: 500 | 15 days supply | Qty: 30 | Fill #1

## 2017-10-11 ENCOUNTER — Encounter (HOSPITAL_COMMUNITY): Payer: Self-pay

## 2017-10-11 ENCOUNTER — Other Ambulatory Visit: Payer: Self-pay

## 2017-10-11 ENCOUNTER — Emergency Department (HOSPITAL_COMMUNITY)
Admission: EM | Admit: 2017-10-11 | Discharge: 2017-10-12 | Disposition: A | Discharge status: Home / Self Care | Payer: Self-pay | Attending: Emergency Medicine | Admitting: Emergency Medicine

## 2017-10-11 DIAGNOSIS — N179 Acute kidney failure, unspecified: Secondary | ICD-10-CM | POA: Insufficient documentation

## 2017-10-11 DIAGNOSIS — T672XXA Heat cramp, initial encounter: Secondary | ICD-10-CM | POA: Insufficient documentation

## 2017-10-11 DIAGNOSIS — I10 Essential (primary) hypertension: Secondary | ICD-10-CM | POA: Insufficient documentation

## 2017-10-11 DIAGNOSIS — E860 Dehydration: Secondary | ICD-10-CM | POA: Insufficient documentation

## 2017-10-11 DIAGNOSIS — Z79899 Other long term (current) drug therapy: Secondary | ICD-10-CM | POA: Insufficient documentation

## 2017-10-11 DIAGNOSIS — F17210 Nicotine dependence, cigarettes, uncomplicated: Secondary | ICD-10-CM | POA: Insufficient documentation

## 2017-10-11 LAB — CBC WITH DIFFERENTIAL/PLATELET
Abs Immature Granulocytes: 0 10*3/uL (ref 0.0–0.1)
BASOS ABS: 0 10*3/uL (ref 0.0–0.1)
Basophils Relative: 0 %
EOS ABS: 0 10*3/uL (ref 0.0–0.7)
EOS PCT: 0 %
HCT: 43.4 % (ref 39.0–52.0)
Hemoglobin: 14.5 g/dL (ref 13.0–17.0)
IMMATURE GRANULOCYTES: 0 %
LYMPHS ABS: 1.3 10*3/uL (ref 0.7–4.0)
LYMPHS PCT: 13 %
MCH: 28.1 pg (ref 26.0–34.0)
MCHC: 33.4 g/dL (ref 30.0–36.0)
MCV: 84.1 fL (ref 78.0–100.0)
Monocytes Absolute: 0.5 10*3/uL (ref 0.1–1.0)
Monocytes Relative: 5 %
NEUTROS PCT: 82 %
Neutro Abs: 8.2 10*3/uL — ABNORMAL HIGH (ref 1.7–7.7)
Platelets: 511 10*3/uL — ABNORMAL HIGH (ref 150–400)
RBC: 5.16 MIL/uL (ref 4.22–5.81)
RDW: 15.1 % (ref 11.5–15.5)
WBC: 10 10*3/uL (ref 4.0–10.5)

## 2017-10-11 LAB — I-STAT CG4 LACTIC ACID, ED: Lactic Acid, Venous: 4.16 mmol/L (ref 0.5–1.9)

## 2017-10-11 MED ORDER — SODIUM CHLORIDE 0.9 % IV BOLUS
1000.0000 mL | Freq: Once | INTRAVENOUS | Status: AC
  Administered 2017-10-11: 1000 mL via INTRAVENOUS

## 2017-10-11 NOTE — ED Triage Notes (Signed)
Patient comes in by EMS for generalized cramping for 2 days.  Cramping increased today.  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  Vitals stable for EMS. A&Ox4, 18 gauge IV with 200 CC fluids infused.

## 2017-10-11 NOTE — ED Provider Notes (Signed)
[facility] EMERGENCY DEPARTMENT Provider Note  CSN: [id number] Arrival date & time: 10/11/17 2315  Chief Complaint(s) Generalized Body Aches  HPI Colin Warner is a 57 y.o. male with a history of hypertension who presents to the emergency department with 2 days of gradually worsening generalized muscular cramps.  Cramps began in lower extremities and have spread up to abdomen and upper extremities.  Pain and factors noted.  Patient reports that he is been working outside in the heat.  He is tried hydrating.  Endorses daily alcohol consumption of a 40 ounce beer per day.  Last drink was yesterday.  He denies any nausea/vomiting/diarrhea.  No recent fevers or infections.  He denies any recent injuries, spider bites.  HPI  Past Medical History Past Medical History:  Diagnosis Date  . Hypertension    Patient Active Problem List   Diagnosis Date Noted  . Hyperlipidemia 08/04/2017  . Acute upper GI bleed   . GI bleed 12/13/2015  . Essential hypertension 12/13/2015  . Acute blood loss anemia 12/13/2015  . Left knee pain 12/13/2015  . Alcohol use disorder, moderate, dependence ([place]) 12/13/2015   Home Medication(s) Prior to Admission medications   Medication Sig Start Date End Date Taking? Authorizing Provider  lisinopril-hydrochlorothiazide (PRINZIDE,ZESTORETIC) 20-25 MG tablet Take 1 tablet by mouth daily. 09/25/17   Charlott Rakes, MD  naproxen (NAPROSYN) 500 MG tablet Take 1 tablet (500 mg total) by mouth 2 (two) times daily with a meal. Patient not taking: Reported on 09/25/2017 08/04/17   Charlott Rakes, MD  omeprazole (PRILOSEC) 20 MG capsule Take 1 capsule (20 mg total) by mouth daily. Patient not taking: Reported on 09/25/2017 08/04/17   Charlott Rakes, MD  predniSONE (DELTASONE) 20 MG tablet TAKE 1 TABLET BY MOUTH 2 (TWO) TIMES DAILY WITH A MEAL. 10/03/17   Charlott Rakes, MD  simvastatin (ZOCOR) 20 MG tablet Take 1 tablet (20 mg total) by mouth at bedtime. 08/04/17    Charlott Rakes, MD                                                                                                                                    Past Surgical History Past Surgical History:  Procedure Laterality Date  . ESOPHAGOGASTRODUODENOSCOPY N/A 12/14/2015   Procedure: ESOPHAGOGASTRODUODENOSCOPY (EGD);  Surgeon: Wonda Horner, MD;  [facility] ENDOSCOPY;  Service: Endoscopy;  Laterality: N/A;  . Urologic surgery, unknown, for hematuria      Family History Family History  Problem Relation Age of Onset  . Hypertension Mother   . Chronic Renal Failure Mother   . Alcoholism Father     Social History Social History   Tobacco Use  . Smoking status: Current Every Day Smoker    Packs/day: 0.50    Types: Cigarettes  . Smokeless tobacco: Never Used  . Tobacco comment: 5-6 daily  Substance Use Topics  . Alcohol use: Yes    Comment: 2  months ago  . Drug use: No   Allergies Patient has no known allergies.  Review of Systems Review of Systems All other systems are reviewed and are negative for acute change except as noted in the HPI  Physical Exam Vital Signs  I have reviewed the triage vital signs BP (!) 143/84 (BP Location: Right Arm)   Pulse 69   Temp 98.7 F (37.1 C) (Oral)   Resp 14   Ht 5\' 5"  (1.651 m)   Wt 63.5 kg (140 lb)   SpO2 96%   BMI 23.30 kg/m   Physical Exam  Constitutional: He is oriented to person, place, and time. He appears well-developed and well-nourished. No distress.  HENT:  Head: Normocephalic and atraumatic.  Nose: Nose normal.  Eyes: Pupils are equal, round, and reactive to light. Conjunctivae and EOM are normal. Right eye exhibits no discharge. Left eye exhibits no discharge. No scleral icterus.  Neck: Normal range of motion. Neck supple.  Cardiovascular: Normal rate and regular rhythm. Exam reveals no gallop and no friction rub.  No murmur heard. Pulmonary/Chest: Effort normal and breath sounds normal. No stridor. No respiratory  distress. He has no rales.  Abdominal: Soft. He exhibits no distension. There is generalized tenderness.  Musculoskeletal: He exhibits no edema or tenderness.  Neurological: He is alert and oriented to person, place, and time. He displays no tremor. He exhibits abnormal muscle tone (increased tone).  Skin: Skin is warm and dry. No rash noted. He is not diaphoretic. No erythema.  Psychiatric: He has a normal mood and affect.  Vitals reviewed.   ED Results and Treatments Labs (all labs ordered are listed, but only abnormal results are displayed) Labs Reviewed  CBC WITH DIFFERENTIAL/PLATELET - Abnormal; Notable for the following components:      Result Value   Platelets 511 (*)    Neutro Abs 8.2 (*)    All other components within normal limits  COMPREHENSIVE METABOLIC PANEL - Abnormal; Notable for the following components:   Sodium 134 (*)    Chloride 97 (*)    Glucose, Bld 127 (*)    BUN 30 (*)    Creatinine, Ser 2.84 (*)    GFR calc non Af Amer 23 (*)    GFR calc Af Amer 27 (*)    All other components within normal limits  URINALYSIS, ROUTINE W REFLEX MICROSCOPIC - Abnormal; Notable for the following components:   APPearance HAZY (*)    All other components within normal limits  BASIC METABOLIC PANEL - Abnormal; Notable for the following components:   Glucose, Bld 105 (*)    BUN 25 (*)    Creatinine, Ser 2.15 (*)    Calcium 8.5 (*)    GFR calc non Af Amer 33 (*)    GFR calc Af Amer 38 (*)    All other components within normal limits  I-STAT CG4 LACTIC ACID, ED - Abnormal; Notable for the following components:   Lactic Acid, Venous 4.16 (*)    All other components within normal limits  CK  I-STAT CG4 LACTIC ACID, ED  EKG  EKG Interpretation  Date/Time:  Wednesday October 11 2017 23:42:50 EDT Ventricular Rate:  80 PR Interval:    QRS Duration: 79 QT  Interval:  406 QTC Calculation: 469 R Axis:   51 Text Interpretation:  Sinus rhythm Consider left atrial enlargement No significant change since last tracing Confirmed by Addison Lank [phone]) on 10/12/2017 1:10:37 AM      Radiology No results found. Pertinent labs & imaging results that were available during my care of the patient were reviewed by me and considered in my medical decision making (see chart for details).  Medications Ordered in ED Medications  sodium chloride 0.9 % bolus 1,000 mL (0 mLs Intravenous Stopped 10/12/17 0129)  sodium chloride 0.9 % bolus 1,000 mL (0 mLs Intravenous Stopped 10/12/17 0136)  sodium chloride 0.9 % bolus 1,000 mL (0 mLs Intravenous Stopped 10/12/17 0210)                                                                                                                                    Procedures Procedures CRITICAL CARE Performed by: Grayce Sessions Cardama Total critical care time: 30 minutes Critical care time was exclusive of separately billable procedures and treating other patients. Critical care was necessary to treat or prevent imminent or life-threatening deterioration. Critical care was time spent personally by me on the following activities: development of treatment plan with patient and/or surrogate as well as nursing, discussions with consultants, evaluation of patient's response to treatment, examination of patient, obtaining history from patient or surrogate, ordering and performing treatments and interventions, ordering and review of laboratory studies, ordering and review of radiographic studies, pulse oximetry and re-evaluation of patient's condition.   (including critical care time)  Medical Decision Making / ED Course I have reviewed the nursing notes for this encounter and the patient's prior records (if available in EHR or on provided paperwork).  Clinical Course as of Oct 13 250  Wed Oct 11, 2017  2322 Patient here with total  body muscle cramps.  Denies any injury suspicious for tetany, no known insect bites.  No scorpion bites.  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  States that he has been trying to hydrate. also a daily alcohol drinker approximately 140 ounce per day.  Concerned for heat exhaustion/cramps. IVF started.   [PC]  Thu Oct 12, 2017  0001 Lactic acid elevated greater than 4.  Doubt sepsis.  Likely dehydration.  We will continue IV hydration and trend.   [PC]  0030 Labs with AKI.  Again likely secondary to dehydration.   [PC]  [id number] Patient received a total of 3 L IV fluids.  Repeat lactic acid cleared.  Repeat BMP with improving renal function.  Patient has been up and about, able to ambulate without complication.  Cramps have completely resolved.     [PC]  [id number] The patient is safe for discharge with strict return precautions.    [  PC]    Clinical Course User Index [PC] Cardama, Grayce Sessions, MD     Final Clinical Impression(s) / ED Diagnoses Final diagnoses:  Heat cramps, initial encounter  AKI (acute kidney injury) ([place])  Dehydration   Disposition: Discharge  Condition: Good  I have discussed the results, Dx and Tx plan with the patient who expressed understanding and agree(s) with the plan. Discharge instructions discussed at great length. The patient was given strict return precautions who verbalized understanding of the instructions. No further questions at time of discharge.    ED Discharge Orders    None       Follow Up: Charlott Rakes, MD [place] [id number] [phone]  Schedule an appointment as soon as possible for a visit  If symptoms do not improve or  worsen      This chart was dictated using voice recognition software.  Despite best efforts to proofread,  errors can occur which can change the documentation meaning.   Fatima Blank, MD 10/12/17 [phone]

## 2017-10-12 LAB — BASIC METABOLIC PANEL
Anion gap: 10 (ref 5–15)
BUN: 25 mg/dL — AB (ref 6–20)
CO2: 24 mmol/L (ref 22–32)
CREATININE: 2.15 mg/dL — AB (ref 0.61–1.24)
Calcium: 8.5 mg/dL — ABNORMAL LOW (ref 8.9–10.3)
Chloride: 106 mmol/L (ref 98–111)
GFR calc Af Amer: 38 mL/min — ABNORMAL LOW (ref >60)
GFR, EST NON AFRICAN AMERICAN: 33 mL/min — AB (ref >60)
GLUCOSE: 105 mg/dL — AB (ref 70–99)
POTASSIUM: 4.3 mmol/L (ref 3.5–5.1)
Sodium: 140 mmol/L (ref 135–145)

## 2017-10-12 LAB — COMPREHENSIVE METABOLIC PANEL
ALK PHOS: 81 U/L (ref 38–126)
ALT: 16 U/L (ref 0–44)
ANION GAP: 15 (ref 5–15)
AST: 31 U/L (ref 15–41)
Albumin: 4.5 g/dL (ref 3.5–5.0)
BUN: 30 mg/dL — ABNORMAL HIGH (ref 6–20)
CO2: 22 mmol/L (ref 22–32)
CREATININE: 2.84 mg/dL — AB (ref 0.61–1.24)
Calcium: 9.9 mg/dL (ref 8.9–10.3)
Chloride: 97 mmol/L — ABNORMAL LOW (ref 98–111)
GFR, EST AFRICAN AMERICAN: 27 mL/min — AB (ref >60)
GFR, EST NON AFRICAN AMERICAN: 23 mL/min — AB (ref >60)
Glucose, Bld: 127 mg/dL — ABNORMAL HIGH (ref 70–99)
Potassium: 4.1 mmol/L (ref 3.5–5.1)
Sodium: 134 mmol/L — ABNORMAL LOW (ref 135–145)
Total Bilirubin: 1.2 mg/dL (ref 0.3–1.2)
Total Protein: 8.1 g/dL (ref 6.5–8.1)

## 2017-10-12 LAB — URINALYSIS, ROUTINE W REFLEX MICROSCOPIC
BILIRUBIN URINE: NEGATIVE
Glucose, UA: NEGATIVE mg/dL
Hgb urine dipstick: NEGATIVE
Ketones, ur: NEGATIVE mg/dL
LEUKOCYTES UA: NEGATIVE
NITRITE: NEGATIVE
PH: 5 (ref 5.0–8.0)
Protein, ur: NEGATIVE mg/dL
Specific Gravity, Urine: 1.016 (ref 1.005–1.030)

## 2017-10-12 LAB — CK: Total CK: 302 U/L (ref 49–397)

## 2017-10-12 LAB — I-STAT CG4 LACTIC ACID, ED: LACTIC ACID, VENOUS: 1.18 mmol/L (ref 0.5–1.9)

## 2017-10-12 MED ORDER — SODIUM CHLORIDE 0.9 % IV BOLUS
1000.0000 mL | Freq: Once | INTRAVENOUS | Status: AC
  Administered 2017-10-12: 1000 mL via INTRAVENOUS

## 2017-10-12 NOTE — ED Notes (Signed)
Patient Alert and oriented to baseline. Stable and ambulatory to baseline. Patient verbalized understanding of the discharge instructions.  Patient belongings were taken by the patient.   

## 2017-12-26 ENCOUNTER — Ambulatory Visit: Payer: Self-pay | Admitting: Family Medicine

## 2018-02-20 MED FILL — NAPROXEN 500 MG TABLET: 500 | 15 days supply | Qty: 30 | Fill #2

## 2018-02-20 MED FILL — OMEPRAZOLE 20 MG CAP: 20 | 30 days supply | Qty: 30 | Fill #2

## 2018-02-20 MED FILL — LISINOPRIL-HCTZ 20-25 MG TA: 20-25 | 30 days supply | Qty: 30 | Fill #1

## 2018-02-20 MED FILL — SIMVASTATIN 20 MG TABLET: 20 | 30 days supply | Qty: 30 | Fill #2

## 2018-06-28 MED FILL — SIMVASTATIN 20 MG TABLET: 20 | 30 days supply | Qty: 30 | Fill #3

## 2018-06-28 MED FILL — OMEPRAZOLE 20 MG CAP: 20 | 30 days supply | Qty: 30 | Fill #3

## 2018-06-28 MED FILL — LISINOPRIL-HCTZ 20-25 MG TA: 20-25 | 30 days supply | Qty: 30 | Fill #2

## 2018-08-30 ENCOUNTER — Emergency Department (HOSPITAL_COMMUNITY): Payer: Self-pay

## 2018-08-30 ENCOUNTER — Other Ambulatory Visit: Payer: Self-pay

## 2018-08-30 ENCOUNTER — Encounter (HOSPITAL_COMMUNITY): Payer: Self-pay | Admitting: Emergency Medicine

## 2018-08-30 ENCOUNTER — Emergency Department (HOSPITAL_COMMUNITY)
Admission: EM | Admit: 2018-08-30 | Discharge: 2018-08-30 | Disposition: A | Discharge status: Home / Self Care | Payer: Self-pay | Attending: Emergency Medicine | Admitting: Emergency Medicine

## 2018-08-30 DIAGNOSIS — M2550 Pain in unspecified joint: Secondary | ICD-10-CM | POA: Insufficient documentation

## 2018-08-30 DIAGNOSIS — I10 Essential (primary) hypertension: Secondary | ICD-10-CM | POA: Insufficient documentation

## 2018-08-30 DIAGNOSIS — F17210 Nicotine dependence, cigarettes, uncomplicated: Secondary | ICD-10-CM | POA: Insufficient documentation

## 2018-08-30 LAB — CBC WITH DIFFERENTIAL/PLATELET
Abs Immature Granulocytes: 0.03 10*3/uL (ref 0.00–0.07)
Basophils Absolute: 0 10*3/uL (ref 0.0–0.1)
Basophils Relative: 0 %
Eosinophils Absolute: 0 10*3/uL (ref 0.0–0.5)
Eosinophils Relative: 0 %
HCT: 42.7 % (ref 39.0–52.0)
Hemoglobin: 14.3 g/dL (ref 13.0–17.0)
Immature Granulocytes: 0 %
Lymphocytes Relative: 15 %
Lymphs Abs: 1.2 10*3/uL (ref 0.7–4.0)
MCH: 29.4 pg (ref 26.0–34.0)
MCHC: 33.5 g/dL (ref 30.0–36.0)
MCV: 87.9 fL (ref 80.0–100.0)
Monocytes Absolute: 0.6 10*3/uL (ref 0.1–1.0)
Monocytes Relative: 7 %
Neutro Abs: 6.4 10*3/uL (ref 1.7–7.7)
Neutrophils Relative %: 78 %
Platelets: 472 10*3/uL — ABNORMAL HIGH (ref 150–400)
RBC: 4.86 MIL/uL (ref 4.22–5.81)
RDW: 13.9 % (ref 11.5–15.5)
WBC: 8.2 10*3/uL (ref 4.0–10.5)
nRBC: 0 % (ref 0.0–0.2)

## 2018-08-30 LAB — BASIC METABOLIC PANEL
Anion gap: 9 (ref 5–15)
BUN: 6 mg/dL (ref 6–20)
CO2: 25 mmol/L (ref 22–32)
Calcium: 9.2 mg/dL (ref 8.9–10.3)
Chloride: 101 mmol/L (ref 98–111)
Creatinine, Ser: 0.85 mg/dL (ref 0.61–1.24)
GFR calc Af Amer: >60 mL/min (ref >60)
GFR calc non Af Amer: >60 mL/min (ref >60)
Glucose, Bld: 93 mg/dL (ref 70–99)
Potassium: 3.7 mmol/L (ref 3.5–5.1)
Sodium: 135 mmol/L (ref 135–145)

## 2018-08-30 LAB — C-REACTIVE PROTEIN: CRP: <0.8 mg/dL (ref <1.0)

## 2018-08-30 LAB — SEDIMENTATION RATE: Sed Rate: 7 mm/hr (ref 0–16)

## 2018-08-30 MED ORDER — OXYCODONE-ACETAMINOPHEN 5-325 MG PO TABS: 1.0000 | ORAL_TABLET | Freq: Four times a day (QID) | ORAL | 0 refills | Status: DC | PRN

## 2018-08-30 MED ORDER — OXYCODONE-ACETAMINOPHEN 5-325 MG PO TABS
1.0000 | ORAL_TABLET | Freq: Once | ORAL | Status: AC
  Administered 2018-08-30: 1 via ORAL
  Filled 2018-08-30: qty 1

## 2018-08-30 MED ORDER — PREDNISONE 50 MG PO TABS: 50.0000 mg | ORAL_TABLET | Freq: Every day | ORAL | 0 refills | Status: DC

## 2018-08-30 NOTE — ED Notes (Signed)
Patient verbalizes understanding of discharge instructions. Opportunity for questioning and answers were provided. Armband removed by staff, pt discharged from ED.  

## 2018-08-30 NOTE — ED Notes (Signed)
Pt to xray

## 2018-08-30 NOTE — Discharge Instructions (Addendum)
Please read attached information. If you experience any new or worsening signs or symptoms please return to the emergency room for evaluation. Please follow-up with your primary care provider or specialist as discussed. Please use medication prescribed only as directed and discontinue taking if you have any concerning signs or symptoms.   °

## 2018-08-30 NOTE — ED Triage Notes (Signed)
Pt arrives to ED from home with complaints of right wrist pain that started yesterday at work when he twisted it the wrong way. Pt states that today the pain started shooting up his arm.

## 2018-08-30 NOTE — ED Provider Notes (Signed)
[facility] EMERGENCY DEPARTMENT Provider Note   CSN: [id number] Arrival date & time: 08/30/18  1153    History   Chief Complaint Chief Complaint  Patient presents with  . Wrist Pain    HPI Colin Warner is a 58 y.o. male.     HPI   58 year old male presents today with complaints of hand and wrist pain.  Patient notes last night when getting off work he noted pain in his right hand and wrist.  He notes this is worsened over the night.  He notes minor swelling over the third MCP and wrist.  He denies any redness, fever, nausea or vomiting.  He denies any trauma but notes that he uses hands frequently at work.  Patient denies any pain to the proximal upper extremity.  No medications prior to arrival.  Patient notes a history of hypertension but notes he did not take his blood pressure medication this morning.  He notes he has medication at home.  Denies any hypertensive related complaints.  No history of gout, no trauma.  No other joint involvement.  He denies any recent insect or tick bites.  Denies any recent infectious symptoms.  No penile discharge, he is not sexually active.   Past Medical History:  Diagnosis Date  . Hypertension     Patient Active Problem List   Diagnosis Date Noted  . Hyperlipidemia 08/04/2017  . Acute upper GI bleed   . GI bleed 12/13/2015  . Essential hypertension 12/13/2015  . Acute blood loss anemia 12/13/2015  . Left knee pain 12/13/2015  . Alcohol use disorder, moderate, dependence ([place]) 12/13/2015    Past Surgical History:  Procedure Laterality Date  . ESOPHAGOGASTRODUODENOSCOPY N/A 12/14/2015   Procedure: ESOPHAGOGASTRODUODENOSCOPY (EGD);  Surgeon: Wonda Horner, MD;  Location: [facility] ENDOSCOPY;  Service: Endoscopy;  Laterality: N/A;  . Urologic surgery, unknown, for hematuria           Home Medications    Prior to Admission medications   Medication Sig Start Date End Date Taking? Authorizing Provider   lisinopril-hydrochlorothiazide (PRINZIDE,ZESTORETIC) 20-25 MG tablet Take 1 tablet by mouth daily. 09/25/17   Charlott Rakes, MD  naproxen (NAPROSYN) 500 MG tablet Take 1 tablet (500 mg total) by mouth 2 (two) times daily with a meal. Patient not taking: Reported on 09/25/2017 08/04/17   Charlott Rakes, MD  omeprazole (PRILOSEC) 20 MG capsule Take 1 capsule (20 mg total) by mouth daily. Patient not taking: Reported on 09/25/2017 08/04/17   Charlott Rakes, MD  oxyCODONE-acetaminophen (PERCOCET/ROXICET) 5-325 MG tablet Take 1 tablet by mouth every 6 (six) hours as needed. 08/30/18   Broxton Broady, Dellis Filbert, PA-C  predniSONE (DELTASONE) 50 MG tablet Take 1 tablet (50 mg total) by mouth daily. 08/30/18   Daylin Gruszka, Dellis Filbert, PA-C  simvastatin (ZOCOR) 20 MG tablet Take 1 tablet (20 mg total) by mouth at bedtime. 08/04/17   Charlott Rakes, MD    Family History Family History  Problem Relation Age of Onset  . Hypertension Mother   . Chronic Renal Failure Mother   . Alcoholism Father     Social History Social History   Tobacco Use  . Smoking status: Current Every Day Smoker    Packs/day: 0.50    Types: Cigarettes  . Smokeless tobacco: Never Used  . Tobacco comment: 5-6 daily  Substance Use Topics  . Alcohol use: Yes    Comment: 2 months ago  . Drug use: No     Allergies   Patient has no known  allergies.   Review of Systems Review of Systems  All other systems reviewed and are negative.   Physical Exam Updated Vital Signs BP (!) 180/117 (BP Location: Left Arm)   Pulse 75   Temp 98.6 F (37 C) (Oral)   Resp 16   Ht 5\' 4"  (1.626 m)   Wt 65.8 kg   SpO2 97%   BMI 24.89 kg/m   Physical Exam Vitals signs and nursing note reviewed.  Constitutional:      Appearance: He is well-developed.  HENT:     Head: Normocephalic and atraumatic.  Eyes:     General: No scleral icterus.       Right eye: No discharge.        Left eye: No discharge.     Conjunctiva/sclera: Conjunctivae normal.      Pupils: Pupils are equal, round, and reactive to light.  Neck:     Musculoskeletal: Normal range of motion.     Vascular: No JVD.     Trachea: No tracheal deviation.  Pulmonary:     Effort: Pulmonary effort is normal.     Breath sounds: No stridor.  Musculoskeletal:     Comments: Minor swelling at the right MCP, minimal swelling at the right wrist, limited range of motion of both no overlying redness, no significant warmth to touch, remainder of upper extremity including forearm elbow and shoulder nontender-cap refill is intact, sensation intact joint compartments are soft  Neurological:     Mental Status: He is alert and oriented to person, place, and time.     Coordination: Coordination normal.  Psychiatric:        Behavior: Behavior normal.        Thought Content: Thought content normal.        Judgment: Judgment normal.      ED Treatments / Results  Labs (all labs ordered are listed, but only abnormal results are displayed) Labs Reviewed  CBC WITH DIFFERENTIAL/PLATELET - Abnormal; Notable for the following components:      Result Value   Platelets 472 (*)    All other components within normal limits  BASIC METABOLIC PANEL  C-REACTIVE PROTEIN  SEDIMENTATION RATE    EKG None  Radiology Dg Wrist Complete Right  Result Date: 08/30/2018 CLINICAL DATA:  Acute right wrist pain after possible injury. EXAM: RIGHT WRIST - COMPLETE 3+ VIEW COMPARISON:  None. FINDINGS: There is no evidence of fracture or dislocation. Joint spaces are intact. Chondrocalcinosis of triangular fibrocartilage is noted. Soft tissues are unremarkable. IMPRESSION: Chondrocalcinosis of triangular fibrocartilage is noted. No other significant abnormality seen in the right wrist. Electronically Signed   By: Marijo Conception M.D.   On: 08/30/2018 13:24   Dg Hand Complete Right  Result Date: 08/30/2018 CLINICAL DATA:  Swelling 3rd MCP and wrist. EXAM: RIGHT HAND - COMPLETE 3+ VIEW COMPARISON:  No recent prior.  FINDINGS: Corticated bony density noted the base of the right fifth metacarpal. This is most likely a site of old fracture fragment. Small corticated bony density noted along the posterior aspect of the distal interphalangeal joint of the third digit is most likely a old fracture fragment. Diffuse degenerative change. Degenerative changes most prior about the radiocarpal joint and second and third metacarpal phalangeal joints. Calcification noted along the triangular fibrocartilage, most likely degenerative. Tiny well-circumscribed corticated lucency noted along the midportion of the proximal phalanx right second digit. This most likely benign. No radiopaque foreign bodies. IMPRESSION: 1. Corticated bony density at base of the right  fifth metacarpal. Most likely old fracture fragment. Corticated bony density noted along the posterior aspect of the right third distal interphalangeal joint, most likely old fracture fragment. No acute bony abnormality identified. No radiopaque foreign body. 2. Diffuse degenerative change. No evidence of inflammatory arthropathy. Electronically Signed   By: Marcello Moores  Register   On: 08/30/2018 13:28    Procedures Procedures (including critical care time)  Medications Ordered in ED Medications  oxyCODONE-acetaminophen (PERCOCET/ROXICET) 5-325 MG per tablet 1 tablet (1 tablet Oral Given 08/30/18 1338)     Initial Impression / Assessment and Plan / ED Course  I have reviewed the triage vital signs and the nursing notes.  Pertinent labs & imaging results that were available during my care of the patient were reviewed by me and considered in my medical decision making (see chart for details).        58 year old male presents today with complaints of joint pain.  He has 2 separate areas his third MCP and his wrist that have minor amount of swelling and pain.  He has no history of trauma, no joint effusions noted on plain films, no overlying redness.  He is afebrile with no  white count hand no elevation in inflammatory markers.  I have very low suspicion for acute infectious etiology.  He has no history of gout.  He is on blood pressure medication but forgot to take it today.  No signs of complications from hypertension.  Patient will be placed on prednisone and given a prescription for Percocet.  He will follow-up closely with his primary care if symptoms persist and return immediately if they worsen.  He verbalized understanding and agreement to today's plan had no further questions or concerns.  Final Clinical Impressions(s) / ED Diagnoses   Final diagnoses:  Arthralgia, unspecified joint    ED Discharge Orders         Ordered    oxyCODONE-acetaminophen (PERCOCET/ROXICET) 5-325 MG tablet  Every 6 hours PRN     08/30/18 1427    predniSONE (DELTASONE) 50 MG tablet  Daily     08/30/18 1427           HedgesDellis Filbert, PA-C 08/30/18 1437    Gareth Morgan, MD 09/02/18 [phone]

## 2018-10-08 ENCOUNTER — Encounter (HOSPITAL_COMMUNITY): Payer: Self-pay | Admitting: *Deleted

## 2018-10-08 ENCOUNTER — Emergency Department (HOSPITAL_COMMUNITY)
Admission: EM | Admit: 2018-10-08 | Discharge: 2018-10-08 | Disposition: A | Discharge status: Home / Self Care | Payer: Self-pay | Attending: Emergency Medicine | Admitting: Emergency Medicine

## 2018-10-08 ENCOUNTER — Other Ambulatory Visit: Payer: Self-pay

## 2018-10-08 DIAGNOSIS — X500XXA Overexertion from strenuous movement or load, initial encounter: Secondary | ICD-10-CM | POA: Insufficient documentation

## 2018-10-08 DIAGNOSIS — F17210 Nicotine dependence, cigarettes, uncomplicated: Secondary | ICD-10-CM | POA: Insufficient documentation

## 2018-10-08 DIAGNOSIS — Z79899 Other long term (current) drug therapy: Secondary | ICD-10-CM | POA: Insufficient documentation

## 2018-10-08 DIAGNOSIS — Y9289 Other specified places as the place of occurrence of the external cause: Secondary | ICD-10-CM | POA: Insufficient documentation

## 2018-10-08 DIAGNOSIS — Y9389 Activity, other specified: Secondary | ICD-10-CM | POA: Insufficient documentation

## 2018-10-08 DIAGNOSIS — S39012A Strain of muscle, fascia and tendon of lower back, initial encounter: Secondary | ICD-10-CM | POA: Insufficient documentation

## 2018-10-08 DIAGNOSIS — Y999 Unspecified external cause status: Secondary | ICD-10-CM | POA: Insufficient documentation

## 2018-10-08 DIAGNOSIS — I10 Essential (primary) hypertension: Secondary | ICD-10-CM | POA: Insufficient documentation

## 2018-10-08 MED ORDER — KETOROLAC TROMETHAMINE 60 MG/2ML IM SOLN
30.0000 mg | Freq: Once | INTRAMUSCULAR | Status: AC
  Administered 2018-10-08: 30 mg via INTRAMUSCULAR
  Filled 2018-10-08: qty 2

## 2018-10-08 NOTE — ED Provider Notes (Signed)
[facility] EMERGENCY DEPARTMENT Provider Note  CSN: [id number] Arrival date & time: 10/08/18 0104  Chief Complaint(s) Back Pain  HPI Colin Warner is a 58 y.o. male with past medical history listed below who presents to the emergency department with 4 to 5 days of left lower back aching/stabbing pain.  Worse with twisting, bending and palpation.  Alleviated by Tylenol and Motrin.  He reports that he first felt it while at work.  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  Felt that while bending down.  Initially pain radiated down his left leg but this has since ceased.  States that he has been controlling the pain with Tylenol Motrin at home.  Denies any lower extremity weakness or loss of sensation.  No bladder/bowel incontinence.  Came in to get it checked to see if he can go back to work.  HPI  Past Medical History Past Medical History:  Diagnosis Date  . Hypertension    Patient Active Problem List   Diagnosis Date Noted  . Hyperlipidemia 08/04/2017  . Acute upper GI bleed   . GI bleed 12/13/2015  . Essential hypertension 12/13/2015  . Acute blood loss anemia 12/13/2015  . Left knee pain 12/13/2015  . Alcohol use disorder, moderate, dependence ([place]) 12/13/2015   Home Medication(s) Prior to Admission medications   Medication Sig Start Date End Date Taking? Authorizing Provider  lisinopril-hydrochlorothiazide (PRINZIDE,ZESTORETIC) 20-25 MG tablet Take 1 tablet by mouth daily. 09/25/17   Charlott Rakes, MD  naproxen (NAPROSYN) 500 MG tablet Take 1 tablet (500 mg total) by mouth 2 (two) times daily with a meal. Patient not taking: Reported on 09/25/2017 08/04/17   Charlott Rakes, MD  omeprazole (PRILOSEC) 20 MG capsule Take 1 capsule (20 mg total) by mouth daily. Patient not taking: Reported on 09/25/2017 08/04/17   Charlott Rakes, MD  oxyCODONE-acetaminophen (PERCOCET/ROXICET) 5-325 MG tablet Take 1 tablet by mouth every 6 (six) hours as needed. 08/30/18    Hedges, Dellis Filbert, PA-C  predniSONE (DELTASONE) 50 MG tablet Take 1 tablet (50 mg total) by mouth daily. 08/30/18   Hedges, Dellis Filbert, PA-C  simvastatin (ZOCOR) 20 MG tablet Take 1 tablet (20 mg total) by mouth at bedtime. 08/04/17   Charlott Rakes, MD                                                                                                                                    Past Surgical History Past Surgical History:  Procedure Laterality Date  . ESOPHAGOGASTRODUODENOSCOPY N/A 12/14/2015   Procedure: ESOPHAGOGASTRODUODENOSCOPY (EGD);  Surgeon: Wonda Horner, MD;  Location: [facility] ENDOSCOPY;  Service: Endoscopy;  Laterality: N/A;  . Urologic surgery, unknown, for hematuria      Family History Family History  Problem Relation Age of Onset  . Hypertension Mother   . Chronic Renal Failure Mother   . Alcoholism Father     Social History Social History  Tobacco Use  . Smoking status: Current Every Day Smoker    Packs/day: 0.50    Types: Cigarettes  . Smokeless tobacco: Never Used  . Tobacco comment: 5-6 daily  Substance Use Topics  . Alcohol use: Yes    Comment: 2 months ago  . Drug use: No   Allergies Patient has no known allergies.  Review of Systems Review of Systems As noted in HPI Physical Exam Vital Signs  I have reviewed the triage vital signs BP (!) 182/107 (BP Location: Right Arm)   Pulse 77   Temp 98.4 F (36.9 C)   Resp 18   SpO2 100%   Physical Exam Vitals signs reviewed.  Constitutional:      General: He is not in acute distress.    Appearance: He is well-developed. He is not diaphoretic.  HENT:     Head: Normocephalic and atraumatic.     Jaw: No trismus.     Right Ear: External ear normal.     Left Ear: External ear normal.     Nose: Nose normal.  Eyes:     General: No scleral icterus.    Conjunctiva/sclera: Conjunctivae normal.  Neck:     Musculoskeletal: Normal range of motion.     Trachea: Phonation normal.  Cardiovascular:     Rate and  Rhythm: Normal rate and regular rhythm.  Pulmonary:     Effort: Pulmonary effort is normal. No respiratory distress.     Breath sounds: No stridor.  Abdominal:     General: There is no distension.  Musculoskeletal: Normal range of motion.     Lumbar back: He exhibits tenderness and spasm. He exhibits no bony tenderness.       Back:  Neurological:     Mental Status: He is alert and oriented to person, place, and time.     Comments: Spine Exam: Strength: 5/5 throughout LE bilaterally  Sensation: Intact to light touch in proximal and distal LE bilaterally    Psychiatric:        Behavior: Behavior normal.     ED Results and Treatments Labs (all labs ordered are listed, but only abnormal results are displayed) Labs Reviewed - No data to display                                                                                                                       EKG  EKG Interpretation  Date/Time:    Ventricular Rate:    PR Interval:    QRS Duration:   QT Interval:    QTC Calculation:   R Axis:     Text Interpretation:        Radiology No results found.  Pertinent labs & imaging results that were available during my care of the patient were reviewed by me and considered in my medical decision making (see chart for details).  Medications Ordered in ED Medications - No data to display  Procedures Procedures  (including critical care time)  Medical Decision Making / ED Course I have reviewed the nursing notes for this encounter and the patient's prior records (if available in EHR or on provided paperwork).   Colin Warner was evaluated in Emergency Department on 10/08/2018 for the symptoms described in the history of present illness. He was evaluated in the context of the global COVID-19 pandemic, which necessitated consideration that the  patient might be at risk for infection with the SARS-CoV-2 virus that causes COVID-19. Institutional protocols and algorithms that pertain to the evaluation of patients at risk for COVID-19 are in a state of rapid change based on information released by regulatory bodies including the CDC and federal and state organizations. These policies and algorithms were followed during the patient's care in the ED.  58 y.o. male presents with back pain in lumbar area for 4-5 days, now without signs of radicular pain. No acute traumatic onset. No red flag symptoms of fever, weight loss, saddle anesthesia, weakness, fecal/urinary incontinence or urinary retention.   Suspect MSK etiology. No indication for imaging emergently. Patient was recommended to take short course of scheduled NSAIDs and engage in early mobility as definitive treatment. Return precautions discussed for worsening or new concerning symptoms.   The patient appears reasonably screened and/or stabilized for discharge and I doubt any other medical condition or other [facility] requiring further screening, evaluation, or treatment in the ED at this time prior to discharge.  The patient is safe for discharge with strict return precautions.      Final Clinical Impression(s) / ED Diagnoses Final diagnoses:  None     The patient appears reasonably screened and/or stabilized for discharge and I doubt any other medical condition or other [facility] requiring further screening, evaluation, or treatment in the ED at this time prior to discharge.  Disposition: Discharge  Condition: Good  I have discussed the results, Dx and Tx plan with the patient who expressed understanding and agree(s) with the plan. Discharge instructions discussed at great length. The patient was given strict return precautions who verbalized understanding of the instructions. No further questions at time of discharge.    ED Discharge Orders    None        Follow Up: Charlott Rakes, MD [place]  [id number] [phone]  Schedule an appointment as soon as possible for a visit       This chart was dictated using voice recognition software.  Despite best efforts to proofread,  errors can occur which can change the documentation meaning.   Fatima Blank, MD 10/08/18 [phone]

## 2018-10-08 NOTE — Discharge Instructions (Addendum)
You may use over-the-counter Motrin (Ibuprofen), Acetaminophen (Tylenol), topical muscle creams such as SalonPas, Icy Hot, Bengay, etc. Please stretch, apply heat, and have massage therapy for additional assistance. ° °

## 2018-10-08 NOTE — ED Triage Notes (Signed)
Pt reports having lower back pain since wed, no specific injury. Has been taking otc meds for pain. Tried to go back to work today and was told he needs a work note.

## 2018-11-26 ENCOUNTER — Other Ambulatory Visit: Payer: Self-pay | Admitting: Family Medicine

## 2018-11-26 DIAGNOSIS — I10 Essential (primary) hypertension: Secondary | ICD-10-CM

## 2018-11-26 DIAGNOSIS — G8929 Other chronic pain: Secondary | ICD-10-CM

## 2018-11-26 DIAGNOSIS — E7800 Pure hypercholesterolemia, unspecified: Secondary | ICD-10-CM

## 2018-11-26 DIAGNOSIS — M25562 Pain in left knee: Secondary | ICD-10-CM

## 2018-11-26 DIAGNOSIS — K2950 Unspecified chronic gastritis without bleeding: Secondary | ICD-10-CM

## 2018-11-28 ENCOUNTER — Other Ambulatory Visit: Payer: Self-pay | Admitting: Family Medicine

## 2018-11-28 DIAGNOSIS — M25562 Pain in left knee: Secondary | ICD-10-CM

## 2018-11-28 DIAGNOSIS — E7800 Pure hypercholesterolemia, unspecified: Secondary | ICD-10-CM

## 2018-11-28 DIAGNOSIS — K2950 Unspecified chronic gastritis without bleeding: Secondary | ICD-10-CM

## 2018-11-28 DIAGNOSIS — I10 Essential (primary) hypertension: Secondary | ICD-10-CM

## 2018-11-28 DIAGNOSIS — G8929 Other chronic pain: Secondary | ICD-10-CM

## 2018-12-06 ENCOUNTER — Ambulatory Visit: Payer: Self-pay | Attending: Family Medicine | Admitting: Physician Assistant

## 2018-12-06 DIAGNOSIS — M25561 Pain in right knee: Secondary | ICD-10-CM

## 2018-12-06 DIAGNOSIS — M25562 Pain in left knee: Secondary | ICD-10-CM

## 2018-12-06 DIAGNOSIS — E7800 Pure hypercholesterolemia, unspecified: Secondary | ICD-10-CM

## 2018-12-06 DIAGNOSIS — G8929 Other chronic pain: Secondary | ICD-10-CM

## 2018-12-06 DIAGNOSIS — K2950 Unspecified chronic gastritis without bleeding: Secondary | ICD-10-CM

## 2018-12-06 DIAGNOSIS — I10 Essential (primary) hypertension: Secondary | ICD-10-CM

## 2018-12-06 MED ORDER — SIMVASTATIN 20 MG PO TABS: 20.0000 mg | ORAL_TABLET | Freq: Every day | ORAL | 1 refills | Status: DC

## 2018-12-06 MED ORDER — LISINOPRIL-HYDROCHLOROTHIAZIDE 20-25 MG PO TABS: 1.0000 | ORAL_TABLET | Freq: Every day | ORAL | 1 refills | Status: DC

## 2018-12-06 MED ORDER — OMEPRAZOLE 20 MG PO CPDR: 20.0000 mg | DELAYED_RELEASE_CAPSULE | Freq: Every day | ORAL | 6 refills | Status: DC

## 2018-12-06 MED ORDER — NAPROXEN 500 MG PO TABS: 500.0000 mg | ORAL_TABLET | Freq: Two times a day (BID) | ORAL | 2 refills | Status: DC

## 2018-12-06 MED FILL — OMEPRAZOLE 20 MG CAP: 20 | 30 days supply | Qty: 30 | Fill #0

## 2018-12-06 MED FILL — NAPROXEN 500 MG TABLET: 500 | 15 days supply | Qty: 30 | Fill #0

## 2018-12-06 MED FILL — LISINOPRIL-HCTZ 20-25 MG TA: 20-25 | 30 days supply | Qty: 30 | Fill #0

## 2018-12-06 MED FILL — SIMVASTATIN 20 MG TABLET: 20 | 30 days supply | Qty: 30 | Fill #0

## 2018-12-06 NOTE — Progress Notes (Signed)
Virtual Visit via Telephone Note  I connected with [name] on 12/06/18 at  4:10 PM EDT by telephone and verified that I am speaking with the correct person using two identifiers.   I discussed the limitations, risks, security and privacy concerns of performing an evaluation and management service by telephone and the availability of in person appointments. I also discussed with the patient that there may be a patient responsible charge related to this service. The patient expressed understanding and agreed to proceed.  Patient location: at work My Location:  [facility] office Persons on the call:  Me and the patient  History of Present Illness:  Patient needing RF of meds.  Says BP OOO is controlled.  He is doing well.  Denies CP/HA/Dizziness/sob.  I reviewed his most recent labs from 08/2018-BMP/CBC/sed rate and C-reactive protein-all essentially normal.      Observations/Objective: NAD.  A&Ox3.     Assessment and Plan: 1. Pure hypercholesterolemia - simvastatin (ZOCOR) 20 MG tablet; Take 1 tablet (20 mg total) by mouth at bedtime.  Dispense: 90 tablet; Refill: 1  2. Essential hypertension Controlled OOO per patient when taking meds - lisinopril-hydrochlorothiazide (ZESTORETIC) 20-25 MG tablet; Take 1 tablet by mouth daily.  Dispense: 90 tablet; Refill: 1  3. Chronic pain of both knees - naproxen (NAPROSYN) 500 MG tablet; Take 1 tablet (500 mg total) by mouth 2 (two) times daily with a meal.  Dispense: 30 tablet; Refill: 2  4. Other chronic gastritis without hemorrhage - omeprazole (PRILOSEC) 20 MG capsule; Take 1 capsule (20 mg total) by mouth daily.  Dispense: 30 capsule; Refill: 6    Follow Up Instructions: See PCP in 3 months;  Sooner if needed   I discussed the assessment and treatment plan with the patient. The patient was provided an opportunity to ask questions and all were answered. The patient agreed with the plan and demonstrated an understanding of the instructions.    The patient was advised to call back or seek an in-person evaluation if the symptoms worsen or if the condition fails to improve as anticipated.  I provided 11 minutes of non-face-to-face time during this encounter.   Colin Caldron, PA-C  Patient ID: Colin Warner, male   DOB: 1960-10-21, 58 y.o.   MRN: [id number]

## 2019-01-07 MED FILL — NAPROXEN 500 MG TABLET: 500 | 15 days supply | Qty: 30 | Fill #1

## 2019-01-07 MED FILL — SIMVASTATIN 20 MG TABLET: 20 | 30 days supply | Qty: 30 | Fill #1

## 2019-01-07 MED FILL — OMEPRAZOLE 20 MG CAP: 20 | 30 days supply | Qty: 30 | Fill #1

## 2019-01-07 MED FILL — LISINOPRIL-HCTZ 20-25 MG TA: 20-25 | 30 days supply | Qty: 30 | Fill #1

## 2019-03-07 ENCOUNTER — Observation Stay (HOSPITAL_COMMUNITY)
Admission: EM | Admit: 2019-03-07 | Discharge: 2019-03-08 | Disposition: A | Discharge status: Home / Self Care | Payer: Self-pay | Attending: Surgery | Admitting: Surgery

## 2019-03-07 ENCOUNTER — Other Ambulatory Visit: Payer: Self-pay

## 2019-03-07 DIAGNOSIS — B954 Other streptococcus as the cause of diseases classified elsewhere: Secondary | ICD-10-CM | POA: Insufficient documentation

## 2019-03-07 DIAGNOSIS — Z20828 Contact with and (suspected) exposure to other viral communicable diseases: Secondary | ICD-10-CM | POA: Insufficient documentation

## 2019-03-07 DIAGNOSIS — B9620 Unspecified Escherichia coli [E. coli] as the cause of diseases classified elsewhere: Secondary | ICD-10-CM | POA: Insufficient documentation

## 2019-03-07 DIAGNOSIS — F17210 Nicotine dependence, cigarettes, uncomplicated: Secondary | ICD-10-CM | POA: Insufficient documentation

## 2019-03-07 DIAGNOSIS — K610 Anal abscess: Principal | ICD-10-CM | POA: Diagnosis present

## 2019-03-07 DIAGNOSIS — K219 Gastro-esophageal reflux disease without esophagitis: Secondary | ICD-10-CM | POA: Insufficient documentation

## 2019-03-07 DIAGNOSIS — Z811 Family history of alcohol abuse and dependence: Secondary | ICD-10-CM | POA: Insufficient documentation

## 2019-03-07 DIAGNOSIS — Z79899 Other long term (current) drug therapy: Secondary | ICD-10-CM | POA: Insufficient documentation

## 2019-03-07 DIAGNOSIS — I700 Atherosclerosis of aorta: Secondary | ICD-10-CM | POA: Insufficient documentation

## 2019-03-07 DIAGNOSIS — B966 Bacteroides fragilis [B. fragilis] as the cause of diseases classified elsewhere: Secondary | ICD-10-CM | POA: Insufficient documentation

## 2019-03-07 DIAGNOSIS — I708 Atherosclerosis of other arteries: Secondary | ICD-10-CM | POA: Insufficient documentation

## 2019-03-07 DIAGNOSIS — I10 Essential (primary) hypertension: Secondary | ICD-10-CM | POA: Insufficient documentation

## 2019-03-07 DIAGNOSIS — Z791 Long term (current) use of non-steroidal anti-inflammatories (NSAID): Secondary | ICD-10-CM | POA: Insufficient documentation

## 2019-03-07 DIAGNOSIS — Z841 Family history of disorders of kidney and ureter: Secondary | ICD-10-CM | POA: Insufficient documentation

## 2019-03-07 DIAGNOSIS — K611 Rectal abscess: Secondary | ICD-10-CM

## 2019-03-07 DIAGNOSIS — L0291 Cutaneous abscess, unspecified: Secondary | ICD-10-CM | POA: Diagnosis present

## 2019-03-07 LAB — CBC WITH DIFFERENTIAL/PLATELET
Abs Immature Granulocytes: 0.08 10*3/uL — ABNORMAL HIGH (ref 0.00–0.07)
Basophils Absolute: 0 10*3/uL (ref 0.0–0.1)
Basophils Relative: 0 %
Eosinophils Absolute: 0 10*3/uL (ref 0.0–0.5)
Eosinophils Relative: 0 %
HCT: 43.6 % (ref 39.0–52.0)
Hemoglobin: 14.7 g/dL (ref 13.0–17.0)
Immature Granulocytes: 1 %
Lymphocytes Relative: 13 %
Lymphs Abs: 2.1 10*3/uL (ref 0.7–4.0)
MCH: 30.1 pg (ref 26.0–34.0)
MCHC: 33.7 g/dL (ref 30.0–36.0)
MCV: 89.3 fL (ref 80.0–100.0)
Monocytes Absolute: 1.7 10*3/uL — ABNORMAL HIGH (ref 0.1–1.0)
Monocytes Relative: 11 %
Neutro Abs: 11.6 10*3/uL — ABNORMAL HIGH (ref 1.7–7.7)
Neutrophils Relative %: 75 %
Platelets: 636 10*3/uL — ABNORMAL HIGH (ref 150–400)
RBC: 4.88 MIL/uL (ref 4.22–5.81)
RDW: 12.9 % (ref 11.5–15.5)
WBC: 15.5 10*3/uL — ABNORMAL HIGH (ref 4.0–10.5)
nRBC: 0 % (ref 0.0–0.2)

## 2019-03-07 LAB — COMPREHENSIVE METABOLIC PANEL
ALT: 13 U/L (ref 0–44)
AST: 18 U/L (ref 15–41)
Albumin: 3.6 g/dL (ref 3.5–5.0)
Alkaline Phosphatase: 77 U/L (ref 38–126)
Anion gap: 12 (ref 5–15)
BUN: 7 mg/dL (ref 6–20)
CO2: 24 mmol/L (ref 22–32)
Calcium: 9.6 mg/dL (ref 8.9–10.3)
Chloride: 98 mmol/L (ref 98–111)
Creatinine, Ser: 0.88 mg/dL (ref 0.61–1.24)
GFR calc Af Amer: >60 mL/min (ref >60)
GFR calc non Af Amer: >60 mL/min (ref >60)
Glucose, Bld: 121 mg/dL — ABNORMAL HIGH (ref 70–99)
Potassium: 4.3 mmol/L (ref 3.5–5.1)
Sodium: 134 mmol/L — ABNORMAL LOW (ref 135–145)
Total Bilirubin: 1.3 mg/dL — ABNORMAL HIGH (ref 0.3–1.2)
Total Protein: 8.1 g/dL (ref 6.5–8.1)

## 2019-03-07 LAB — LACTIC ACID, PLASMA: Lactic Acid, Venous: 1 mmol/L (ref 0.5–1.9)

## 2019-03-07 MED ORDER — SODIUM CHLORIDE 0.9% FLUSH
3.0000 mL | Freq: Once | INTRAVENOUS | Status: AC
  Administered 2019-03-08: 3 mL via INTRAVENOUS

## 2019-03-07 NOTE — ED Triage Notes (Signed)
Pt c/o swelling on his butt; this nurse assessed the swelling and there is a significant mass/abscess (looks like maybe two) close to rectum, in between buttocks

## 2019-03-08 ENCOUNTER — Emergency Department (HOSPITAL_COMMUNITY): Payer: Self-pay

## 2019-03-08 ENCOUNTER — Encounter (HOSPITAL_COMMUNITY): Admission: EM | Disposition: A | Discharge status: Home / Self Care | Payer: Self-pay | Source: Home / Self Care

## 2019-03-08 ENCOUNTER — Encounter (HOSPITAL_COMMUNITY): Payer: Self-pay | Admitting: Radiology

## 2019-03-08 ENCOUNTER — Emergency Department (HOSPITAL_COMMUNITY): Payer: Self-pay | Admitting: Anesthesiology

## 2019-03-08 DIAGNOSIS — L0291 Cutaneous abscess, unspecified: Secondary | ICD-10-CM | POA: Diagnosis present

## 2019-03-08 DIAGNOSIS — K610 Anal abscess: Secondary | ICD-10-CM | POA: Diagnosis present

## 2019-03-08 HISTORY — PX: INCISION AND DRAINAGE PERIRECTAL ABSCESS: SHX1804

## 2019-03-08 LAB — RESPIRATORY PANEL BY RT PCR (FLU A&B, COVID)
Influenza A by PCR: NEGATIVE
Influenza B by PCR: NEGATIVE
SARS Coronavirus 2 by RT PCR: NEGATIVE

## 2019-03-08 LAB — HIV ANTIBODY (ROUTINE TESTING W REFLEX): HIV Screen 4th Generation wRfx: NONREACTIVE

## 2019-03-08 SURGERY — INCISION AND DRAINAGE, ABSCESS, PERIRECTAL
Anesthesia: General | Site: Ankle

## 2019-03-08 MED ORDER — ACETAMINOPHEN 500 MG PO TABS
1000.0000 mg | ORAL_TABLET | Freq: Four times a day (QID) | ORAL | Status: DC
  Administered 2019-03-08: 1000 mg via ORAL
  Filled 2019-03-08 (×2): qty 2

## 2019-03-08 MED ORDER — HYDROCHLOROTHIAZIDE 25 MG PO TABS
25.0000 mg | ORAL_TABLET | Freq: Every day | ORAL | Status: DC
  Administered 2019-03-08: 25 mg via ORAL
  Filled 2019-03-08: qty 1

## 2019-03-08 MED ORDER — TRAMADOL HCL 50 MG PO TABS: 50.0000 mg | ORAL_TABLET | Freq: Four times a day (QID) | ORAL | 0 refills | Status: DC | PRN

## 2019-03-08 MED ORDER — LIDOCAINE HCL (CARDIAC) PF 100 MG/5ML IV SOSY
PREFILLED_SYRINGE | INTRAVENOUS | Status: DC | PRN
  Administered 2019-03-08: 80 mg via INTRATRACHEAL

## 2019-03-08 MED ORDER — IOHEXOL 300 MG/ML  SOLN
100.0000 mL | Freq: Once | INTRAMUSCULAR | Status: AC | PRN
  Administered 2019-03-08: 100 mL via INTRAVENOUS

## 2019-03-08 MED ORDER — SIMVASTATIN 20 MG PO TABS: 20.0000 mg | ORAL_TABLET | Freq: Every day | ORAL | Status: DC

## 2019-03-08 MED ORDER — FENTANYL CITRATE (PF) 250 MCG/5ML IJ SOLN
INTRAMUSCULAR | Status: AC
  Filled 2019-03-08: qty 5

## 2019-03-08 MED ORDER — LISINOPRIL 20 MG PO TABS
20.0000 mg | ORAL_TABLET | Freq: Every day | ORAL | Status: DC
  Administered 2019-03-08: 20 mg via ORAL
  Filled 2019-03-08: qty 1

## 2019-03-08 MED ORDER — PIPERACILLIN-TAZOBACTAM 3.375 G IVPB
3.3750 g | Freq: Once | INTRAVENOUS | Status: AC
  Administered 2019-03-08: 3.375 g via INTRAVENOUS
  Filled 2019-03-08: qty 50

## 2019-03-08 MED ORDER — LACTATED RINGERS IV SOLN
INTRAVENOUS | Status: DC | PRN
  Administered 2019-03-08: 05:00:00 via INTRAVENOUS

## 2019-03-08 MED ORDER — PROPOFOL 10 MG/ML IV BOLUS
INTRAVENOUS | Status: DC | PRN
  Administered 2019-03-08: 130 mg via INTRAVENOUS

## 2019-03-08 MED ORDER — 0.9 % SODIUM CHLORIDE (POUR BTL) OPTIME
TOPICAL | Status: DC | PRN
  Administered 2019-03-08: 05:00:00 1000 mL

## 2019-03-08 MED ORDER — HYDROMORPHONE HCL 1 MG/ML IJ SOLN
1.0000 mg | Freq: Once | INTRAMUSCULAR | Status: AC
  Administered 2019-03-08: 1 mg via INTRAVENOUS
  Filled 2019-03-08: qty 1

## 2019-03-08 MED ORDER — DIPHENHYDRAMINE HCL 12.5 MG/5ML PO ELIX
12.5000 mg | ORAL_SOLUTION | Freq: Four times a day (QID) | ORAL | Status: DC | PRN
  Filled 2019-03-08: qty 5

## 2019-03-08 MED ORDER — SUCCINYLCHOLINE CHLORIDE 20 MG/ML IJ SOLN
INTRAMUSCULAR | Status: DC | PRN
  Administered 2019-03-08: 100 mg via INTRAVENOUS

## 2019-03-08 MED ORDER — PROPOFOL 10 MG/ML IV BOLUS
INTRAVENOUS | Status: AC
  Filled 2019-03-08: qty 20

## 2019-03-08 MED ORDER — ONDANSETRON 4 MG PO TBDP: 4.0000 mg | ORAL_TABLET | Freq: Four times a day (QID) | ORAL | Status: DC | PRN

## 2019-03-08 MED ORDER — PANTOPRAZOLE SODIUM 40 MG PO TBEC
40.0000 mg | DELAYED_RELEASE_TABLET | Freq: Every day | ORAL | Status: DC
  Administered 2019-03-08: 40 mg via ORAL
  Filled 2019-03-08: qty 2

## 2019-03-08 MED ORDER — SIMETHICONE 80 MG PO CHEW: 40.0000 mg | CHEWABLE_TABLET | Freq: Four times a day (QID) | ORAL | Status: DC | PRN

## 2019-03-08 MED ORDER — ONDANSETRON HCL 4 MG/2ML IJ SOLN: 4.0000 mg | Freq: Four times a day (QID) | INTRAMUSCULAR | Status: DC | PRN

## 2019-03-08 MED ORDER — TRAMADOL HCL 50 MG PO TABS
50.0000 mg | ORAL_TABLET | Freq: Four times a day (QID) | ORAL | Status: DC | PRN
  Administered 2019-03-08: 50 mg via ORAL
  Filled 2019-03-08: qty 1

## 2019-03-08 MED ORDER — LISINOPRIL-HYDROCHLOROTHIAZIDE 20-25 MG PO TABS: 1.0000 | ORAL_TABLET | Freq: Every day | ORAL | Status: DC

## 2019-03-08 MED ORDER — LABETALOL HCL 5 MG/ML IV SOLN
INTRAVENOUS | Status: DC | PRN
  Administered 2019-03-08: 10 mg via INTRAVENOUS

## 2019-03-08 MED ORDER — FENTANYL CITRATE (PF) 100 MCG/2ML IJ SOLN
25.0000 ug | INTRAMUSCULAR | Status: DC | PRN
  Administered 2019-03-08: 25 ug via INTRAVENOUS

## 2019-03-08 MED ORDER — AMOXICILLIN-POT CLAVULANATE 875-125 MG PO TABS
1.0000 | ORAL_TABLET | Freq: Two times a day (BID) | ORAL | Status: DC
  Administered 2019-03-08: 1 via ORAL
  Filled 2019-03-08: qty 1

## 2019-03-08 MED ORDER — FENTANYL CITRATE (PF) 250 MCG/5ML IJ SOLN
INTRAMUSCULAR | Status: DC | PRN
  Administered 2019-03-08: 50 ug via INTRAVENOUS

## 2019-03-08 MED ORDER — MIDAZOLAM HCL 2 MG/2ML IJ SOLN
INTRAMUSCULAR | Status: AC
  Filled 2019-03-08: qty 2

## 2019-03-08 MED ORDER — ACETAMINOPHEN 500 MG PO TABS: 500.0000 mg | ORAL_TABLET | Freq: Four times a day (QID) | ORAL | Status: AC | PRN

## 2019-03-08 MED ORDER — HEPARIN SODIUM (PORCINE) 5000 UNIT/ML IJ SOLN
5000.0000 [IU] | Freq: Three times a day (TID) | INTRAMUSCULAR | Status: DC
  Filled 2019-03-08: qty 1

## 2019-03-08 MED ORDER — PROMETHAZINE HCL 25 MG/ML IJ SOLN: 6.2500 mg | INTRAMUSCULAR | Status: DC | PRN

## 2019-03-08 MED ORDER — DIPHENHYDRAMINE HCL 50 MG/ML IJ SOLN: 12.5000 mg | Freq: Four times a day (QID) | INTRAMUSCULAR | Status: DC | PRN

## 2019-03-08 MED ORDER — IBUPROFEN 600 MG PO TABS: 600.0000 mg | ORAL_TABLET | Freq: Four times a day (QID) | ORAL | Status: DC | PRN

## 2019-03-08 MED ORDER — MIDAZOLAM HCL 2 MG/2ML IJ SOLN
INTRAMUSCULAR | Status: DC | PRN
  Administered 2019-03-08: 2 mg via INTRAVENOUS

## 2019-03-08 MED ORDER — DOCUSATE SODIUM 100 MG PO CAPS
100.0000 mg | ORAL_CAPSULE | Freq: Two times a day (BID) | ORAL | Status: DC
  Administered 2019-03-08: 100 mg via ORAL
  Filled 2019-03-08: qty 1

## 2019-03-08 MED ORDER — FENTANYL CITRATE (PF) 100 MCG/2ML IJ SOLN
INTRAMUSCULAR | Status: AC
  Filled 2019-03-08: qty 2

## 2019-03-08 MED ORDER — AMOXICILLIN-POT CLAVULANATE 875-125 MG PO TABS: 1.0000 | ORAL_TABLET | Freq: Two times a day (BID) | ORAL | 0 refills | Status: AC

## 2019-03-08 SURGICAL SUPPLY — 33 items
BRIEF STRETCH FOR OB PAD LRG (UNDERPADS AND DIAPERS) ×3 IMPLANT
CANISTER SUCT 3000ML PPV (MISCELLANEOUS) ×3 IMPLANT
COVER SURGICAL LIGHT HANDLE (MISCELLANEOUS) ×3 IMPLANT
COVER WAND RF STERILE (DRAPES) ×3 IMPLANT
DRSG PAD ABDOMINAL 8X10 ST (GAUZE/BANDAGES/DRESSINGS) IMPLANT
ELECT REM PT RETURN 9FT ADLT (ELECTROSURGICAL)
ELECTRODE REM PT RTRN 9FT ADLT (ELECTROSURGICAL) IMPLANT
GAUZE PACKING IODOFORM 1/2 (PACKING) IMPLANT
GAUZE SPONGE 4X4 12PLY STRL (GAUZE/BANDAGES/DRESSINGS) ×6 IMPLANT
GLOVE BIO SURGEON STRL SZ7.5 (GLOVE) ×3 IMPLANT
GLOVE INDICATOR 8.0 STRL GRN (GLOVE) ×3 IMPLANT
GOWN STRL REUS W/ TWL LRG LVL3 (GOWN DISPOSABLE) ×1 IMPLANT
GOWN STRL REUS W/ TWL XL LVL3 (GOWN DISPOSABLE) ×1 IMPLANT
GOWN STRL REUS W/TWL LRG LVL3 (GOWN DISPOSABLE) ×2
GOWN STRL REUS W/TWL XL LVL3 (GOWN DISPOSABLE) ×2
KIT BASIN OR (CUSTOM PROCEDURE TRAY) ×3 IMPLANT
KIT TURNOVER KIT B (KITS) ×3 IMPLANT
NEEDLE 18GX1X1/2 (RX/OR ONLY) (NEEDLE) ×3 IMPLANT
NS IRRIG 1000ML POUR BTL (IV SOLUTION) ×3 IMPLANT
PACK LITHOTOMY IV (CUSTOM PROCEDURE TRAY) ×3 IMPLANT
PAD ABD 8X10 STRL (GAUZE/BANDAGES/DRESSINGS) ×3 IMPLANT
PAD ARMBOARD 7.5X6 YLW CONV (MISCELLANEOUS) ×3 IMPLANT
PENCIL SMOKE EVACUATOR (MISCELLANEOUS) ×3 IMPLANT
SPONGE LAP 18X18 RF (DISPOSABLE) ×3 IMPLANT
SWAB COLLECTION DEVICE MRSA (MISCELLANEOUS) IMPLANT
SWAB CULTURE ESWAB REG 1ML (MISCELLANEOUS) ×3 IMPLANT
SYR CONTROL 10ML LL (SYRINGE) ×3 IMPLANT
TOWEL GREEN STERILE (TOWEL DISPOSABLE) ×3 IMPLANT
TOWEL GREEN STERILE FF (TOWEL DISPOSABLE) ×3 IMPLANT
TUBE CONNECTING 12'X1/4 (SUCTIONS) ×1
TUBE CONNECTING 12X1/4 (SUCTIONS) ×2 IMPLANT
UNDERPAD 30X30 (UNDERPADS AND DIAPERS) ×3 IMPLANT
YANKAUER SUCT BULB TIP NO VENT (SUCTIONS) ×3 IMPLANT

## 2019-03-08 NOTE — Anesthesia Preprocedure Evaluation (Signed)
Anesthesia Evaluation  Patient identified by MRN, date of birth, ID band Patient awake    Reviewed: Allergy & Precautions, NPO status , Patient's Chart, lab work & pertinent test results  Airway Mallampati: II  TM Distance: >3 FB Neck ROM: Full    Dental  (+) Dental Advisory Given, Poor Dentition, Missing, Chipped   Pulmonary Current SmokerPatient did not abstain from smoking.,    Pulmonary exam normal breath sounds clear to auscultation       Cardiovascular hypertension, Pt. on medications Normal cardiovascular exam Rhythm:Regular Rate:Normal     Neuro/Psych negative neurological ROS     GI/Hepatic GERD  Medicated,(+)     substance abuse  alcohol use,   Endo/Other  negative endocrine ROS  Renal/GU negative Renal ROS     Musculoskeletal negative musculoskeletal ROS (+)   Abdominal   Peds  Hematology negative hematology ROS (+)   Anesthesia Other Findings Day of surgery medications reviewed with the patient.  Reproductive/Obstetrics                             Anesthesia Physical Anesthesia Plan  ASA: II and emergent  Anesthesia Plan: General   Post-op Pain Management:    Induction: Intravenous  PONV Risk Score and Plan: 1 and Dexamethasone and Ondansetron  Airway Management Planned: Oral ETT  Additional Equipment:   Intra-op Plan:   Post-operative Plan: Extubation in OR  Informed Consent: I have reviewed the patients History and Physical, chart, labs and discussed the procedure including the risks, benefits and alternatives for the proposed anesthesia with the patient or authorized representative who has indicated his/her understanding and acceptance.     Dental advisory given  Plan Discussed with: CRNA  Anesthesia Plan Comments:         Anesthesia Quick Evaluation

## 2019-03-08 NOTE — Progress Notes (Signed)
[facility]/Trauma Progress Note  Day of Surgery   Assessment/Plan HTN  perianal abscess - S/P incision and drainage of perianal abscess, Dr. Dema Severin, 12/11 - pt is still very sleepy from anesthesia - will check him this afternoon for possible discharge - will discuss urology f/u regarding incidental CT findings\  PCP - [facility]  FEN: reg diet VTE: SCD's, heparin ID: Augmentin  Foley: none Follow up: [facility] 2 weeks     LOS: 1 day    Subjective: CC: no complaints   Pt is very sleepy. He opened his eyes and shook head yes or no. Explained that I would come back later.   Objective: Vital signs in last 24 hours: Temp:  [97.5 F (36.4 C)-99 F (37.2 C)] 98.9 F (37.2 C) (12/11 0649) Pulse Rate:  [79-128] 83 (12/11 0653) Resp:  [16-30] 18 (12/11 0649) BP: (121-182)/(80-137) 129/85 (12/11 0649) SpO2:  [85 %-100 %] 99 % (12/11 0653) Weight:  [63.1 kg] 63.1 kg (12/11 0649)    Intake/Output from previous day: 12/10 0701 - 12/11 0700 In: -  Out: 2 [Blood:2] Intake/Output this shift: No intake/output data recorded.  PE:  Gen:  Alert, NAD, sleepy Card:  RRR, no M/G/R heard Pulm:  CTA, no W/R/R, rate and effort normal Skin: no rashes noted, warm and dry   Anti-infectives: Anti-infectives (From admission, onward)   Start     Dose/Rate Route Frequency Ordered Stop   03/08/19 1000  amoxicillin-clavulanate (AUGMENTIN) 875-125 MG per tablet 1 tablet     1 tablet Oral Every 12 hours 03/08/19 0644     03/08/19 0200  piperacillin-tazobactam (ZOSYN) IVPB 3.375 g     3.375 g 12.5 mL/hr over 240 Minutes Intravenous  Once 03/08/19 0151 03/08/19 0313      Lab Results:  Recent Labs    03/07/19 2124  WBC 15.5*  HGB 14.7  HCT 43.6  PLT 636*   BMET Recent Labs    03/07/19 2124  NA 134*  K 4.3  CL 98  CO2 24  GLUCOSE 121*  BUN 7  CREATININE 0.88  CALCIUM 9.6   PT/INR No results for input(s): LABPROT, INR in  the last 72 hours. CMP     Component Value Date/Time   NA 134 (L) 03/07/2019 2124   NA 140 08/04/2017 1641   K 4.3 03/07/2019 2124   CL 98 03/07/2019 2124   CO2 24 03/07/2019 2124   GLUCOSE 121 (H) 03/07/2019 2124   BUN 7 03/07/2019 2124   BUN 13 08/04/2017 1641   CREATININE 0.88 03/07/2019 2124   CREATININE 0.99 04/07/2016 0832   CALCIUM 9.6 03/07/2019 2124   PROT 8.1 03/07/2019 2124   PROT 7.7 08/04/2017 1641   ALBUMIN 3.6 03/07/2019 2124   ALBUMIN 4.2 08/04/2017 1641   AST 18 03/07/2019 2124   ALT 13 03/07/2019 2124   ALKPHOS 77 03/07/2019 2124   BILITOT 1.3 (H) 03/07/2019 2124   BILITOT 0.6 08/04/2017 1641   GFRNONAA >60 03/07/2019 2124   GFRNONAA 85 04/07/2016 0832   GFRAA >60 03/07/2019 2124   GFRAA >89 04/07/2016 0832   Lipase     Component Value Date/Time   LIPASE 25 12/13/2015 1635    Studies/Results: CT PELVIS W CONTRAST  Result Date: 03/08/2019 CLINICAL DATA:  Evaluate perirectal abscess. Left buttock pain. Lymphadenopathy, inguinal/pelvic perirectal abscess EXAM: CT PELVIS WITH CONTRAST TECHNIQUE: Multidetector CT imaging of the pelvis was performed using the standard protocol following the bolus administration of  intravenous contrast. CONTRAST:  146mL OMNIPAQUE IOHEXOL 300 MG/ML  SOLN COMPARISON:  None. FINDINGS: Urinary Tract: Distal ureters are decompressed. Urinary bladder is partially distended. Diffuse bladder wall thickening and mild enhancement at the bladder base. Bowel: Perirectal abscess is primarily about the left aspect of the rectum. This measures 4.9 x 4.1 x 3.0 cm. There is peripheral enhancement and inflammatory changes extending to the posterior gluteal cleft. Small subcentimeter component may extend in the right perirectal region inferiorly. No internal air. Pelvic bowel loops are otherwise unremarkable. Appendix is tentatively visualized and normal. Vascular/Lymphatic: Aorto bi-iliac atherosclerosis. No iliac aneurysm. Prominent lymph nodes in  the right proximal external iliac station measuring 9 mm, series 5, image 29 in 35 there additional prominent lymph nodes in the left external iliac station to a lesser extent. Multiple prominent left external iliac nodes measuring up to 10 mm short axis. Small right perivesicular node measures 6 mm series 5, image 47, however is enhancing. Reproductive: Heterogeneously enhancing prostate gland that is prominent size and causes mass effect on the bladder base. Other:  No pelvic free fluid or intrapelvic fluid collection. Musculoskeletal: Degenerative change in the included lower lumbar spine. Sclerotic focus in the left iliac bone and right superior pubic ramus. IMPRESSION: 1. Perirectal abscess primarily about the left aspect of the rectum measuring 4.9 x 4.1 x 3.0 cm. There is peripheral enhancement and inflammatory changes extending to the posterior gluteal cleft. 2. Heterogeneously enhancing prostate gland that causes mass effect on the bladder base. There is diffuse bladder wall thickening with enhancement of the bladder base. Recommend correlation with PSA. 3. Left inguinal nodes are likely reactive secondary to perirectal abscess. There also enlarged right external iliac nodes which would not be explained by rectal inflammation, and a small but enhancing 6 mm right perivesicular node. Metastatic disease/neoplasm is not excluded, particularly of the urinary bladder. Recommend urologic consultation for bladder and prostate evaluation. Cystoscopy may be of value. 4. Nonspecific sclerotic osseous foci in the left iliac bone and right superior pubic ramus. Aortic Atherosclerosis (ICD10-I70.0). Electronically Signed   By: Keith Rake M.D.   On: 03/08/2019 01:24     Kalman Drape, [facility] Please see amion for pager for the following: Cristine Polio, & Friday 7:00am - 4:30pm Thursdays 7:00am -11:30am

## 2019-03-08 NOTE — Transfer of Care (Signed)
Immediate Anesthesia Transfer of Care Note  Patient: Colin Warner  Procedure(s) Performed: IRRIGATION AND DEBRIDEMENT PERINEAL ABSCESS EXAM UNDER ANESTHESIA (N/A Ankle)  Patient Location: PACU  Anesthesia Type:General  Level of Consciousness: sedated  Airway & Oxygen Therapy: Patient connected to face mask oxygen  Post-op Assessment: Report given to RN and Post -op Vital signs reviewed and stable  Post vital signs: Reviewed and stable  Last Vitals:  Vitals Value Taken Time  BP 144/113 03/08/19 0532  Temp    Pulse 88 03/08/19 0533  Resp 27 03/08/19 0533  SpO2 89 % 03/08/19 0533  Vitals shown include unvalidated device data.  Last Pain:  Vitals:   03/08/19 0313  TempSrc:   PainSc: Asleep         Complications: No apparent anesthesia complications

## 2019-03-08 NOTE — H&P (Signed)
CC: Buttock/anal pain  Requesting provider: Trish Mage, MD  HPI: Colin Warner is an 58 y.o. male with hx of HTN who presents to the emergency room with a 4-day history of progressively worsening perianal pain.  He describes the pain as sharp.  It has become so severe that he is unable to sit down.  He states that prior to 4 days ago, he had no anal pain or any notable findings back there.  He denies any history of lumps or bumps back there.  He denies any fever/chills.  His only complaint is pain.  He denies any active drainage.  He denies any prior history of abscesses anywhere on his body.  Past Medical History:  Diagnosis Date  . Hypertension     Past Surgical History:  Procedure Laterality Date  . ESOPHAGOGASTRODUODENOSCOPY N/A 12/14/2015   Procedure: ESOPHAGOGASTRODUODENOSCOPY (EGD);  Surgeon: Wonda Horner, MD;  Location: [facility] ENDOSCOPY;  Service: Endoscopy;  Laterality: N/A;  . Urologic surgery, unknown, for hematuria       Family History  Problem Relation Age of Onset  . Hypertension Mother   . Chronic Renal Failure Mother   . Alcoholism Father     Social:  reports that he has been smoking cigarettes. He has been smoking about 0.50 packs per day. He has never used smokeless tobacco. He reports current alcohol use. He reports that he does not use drugs.  Allergies: No Known Allergies  Medications: I have reviewed the patient's current medications.  Results for orders placed or performed during the hospital encounter of 03/07/19 (from the past 48 hour(s))  Lactic acid, plasma     Status: None   Collection Time: 03/07/19  9:24 PM  Result Value Ref Range   Lactic Acid, Venous 1.0 0.5 - 1.9 mmol/L    Comment: Performed at [facility], [place] [address] [place], [place] [address]  Comprehensive metabolic panel     Status: Abnormal   Collection Time: 03/07/19  9:24 PM  Result Value Ref Range   Sodium 134 (L) 135 - 145 mmol/L   Potassium 4.3 3.5 - 5.1 mmol/L   Chloride  98 98 - 111 mmol/L   CO2 24 22 - 32 mmol/L   Glucose, Bld 121 (H) 70 - 99 mg/dL   BUN 7 6 - 20 mg/dL   Creatinine, Ser 0.88 0.61 - 1.24 mg/dL   Calcium 9.6 8.9 - 10.3 mg/dL   Total Protein 8.1 6.5 - 8.1 g/dL   Albumin 3.6 3.5 - 5.0 g/dL   AST 18 15 - 41 U/L   ALT 13 0 - 44 U/L   Alkaline Phosphatase 77 38 - 126 U/L   Total Bilirubin 1.3 (H) 0.3 - 1.2 mg/dL   GFR calc non Af Amer >60 >60 mL/min   GFR calc Af Amer >60 >60 mL/min   Anion gap 12 5 - 15    Comment: Performed at [facility] [address] [place], [place] [address]  CBC with Differential     Status: Abnormal   Collection Time: 03/07/19  9:24 PM  Result Value Ref Range   WBC 15.5 (H) 4.0 - 10.5 K/uL   RBC 4.88 4.22 - 5.81 MIL/uL   Hemoglobin 14.7 13.0 - 17.0 g/dL   HCT 43.6 39.0 - 52.0 %   MCV 89.3 80.0 - 100.0 fL   MCH 30.1 26.0 - 34.0 pg   MCHC 33.7 30.0 - 36.0 g/dL   RDW 12.9 11.5 - 15.5 %  Platelets 636 (H) 150 - 400 K/uL   nRBC 0.0 0.0 - 0.2 %   Neutrophils Relative % 75 %   Neutro Abs 11.6 (H) 1.7 - 7.7 K/uL   Lymphocytes Relative 13 %   Lymphs Abs 2.1 0.7 - 4.0 K/uL   Monocytes Relative 11 %   Monocytes Absolute 1.7 (H) 0.1 - 1.0 K/uL   Eosinophils Relative 0 %   Eosinophils Absolute 0.0 0.0 - 0.5 K/uL   Basophils Relative 0 %   Basophils Absolute 0.0 0.0 - 0.1 K/uL   Immature Granulocytes 1 %   Abs Immature Granulocytes 0.08 (H) 0.00 - 0.07 K/uL    Comment: Performed at [facility] [address], [place], [place] [address]  Respiratory Panel by RT PCR (Flu A&B, Covid) - Nasopharyngeal Swab     Status: None   Collection Time: 03/08/19  2:06 AM   Specimen: Nasopharyngeal Swab  Result Value Ref Range   SARS Coronavirus 2 by RT PCR NEGATIVE NEGATIVE    Comment: (NOTE) SARS-CoV-2 target nucleic acids are NOT DETECTED. The SARS-CoV-2 RNA is generally detectable in upper respiratoy specimens during the acute phase of infection. The lowest concentration of SARS-CoV-2 viral copies this  assay can detect is 131 copies/mL. A negative result does not preclude SARS-Cov-2 infection and should not be used as the sole basis for treatment or other patient management decisions. A negative result may occur with  improper specimen collection/handling, submission of specimen other than nasopharyngeal swab, presence of viral mutation(s) within the areas targeted by this assay, and inadequate number of viral copies (<131 copies/mL). A negative result must be combined with clinical observations, patient history, and epidemiological information. The expected result is Negative. Fact Sheet for Patients:  PinkCheek.be Fact Sheet for Healthcare Providers:  GravelBags.it This test is not yet ap proved or cleared by the [place] FDA and  has been authorized for detection and/or diagnosis of SARS-CoV-2 by FDA under an Emergency Use Authorization (EUA). This EUA will remain  in effect (meaning this test can be used) for the duration of the COVID-19 declaration under Section 564(b)(1) of the Act, 21 U.S.C. section 360bbb-3(b)(1), unless the authorization is terminated or revoked sooner.    Influenza A by PCR NEGATIVE NEGATIVE   Influenza B by PCR NEGATIVE NEGATIVE    Comment: (NOTE) The Xpert Xpress SARS-CoV-2/FLU/RSV assay is intended as an aid in  the diagnosis of influenza from Nasopharyngeal swab specimens and  should not be used as a sole basis for treatment. Nasal washings and  aspirates are unacceptable for Xpert Xpress SARS-CoV-2/FLU/RSV  testing. Fact Sheet for Patients: PinkCheek.be Fact Sheet for Healthcare Providers: GravelBags.it This test is not yet approved or cleared by the [place] FDA and  has been authorized for detection and/or diagnosis of SARS-CoV-2 by  FDA under an Emergency Use Authorization (EUA). This EUA will remain  in effect (meaning  this test can be used) for the duration of the  Covid-19 declaration under Section 564(b)(1) of the Act, 21  U.S.C. section 360bbb-3(b)(1), unless the authorization is  terminated or revoked. Performed at [facility], [place] [address] [place], [place] [address]     CT PELVIS W CONTRAST  Result Date: 03/08/2019 CLINICAL DATA:  Evaluate perirectal abscess. Left buttock pain. Lymphadenopathy, inguinal/pelvic perirectal abscess EXAM: CT PELVIS WITH CONTRAST TECHNIQUE: Multidetector CT imaging of the pelvis was performed using the standard protocol following the bolus administration of intravenous contrast. CONTRAST:  139mL OMNIPAQUE IOHEXOL 300  MG/ML  SOLN COMPARISON:  None. FINDINGS: Urinary Tract: Distal ureters are decompressed. Urinary bladder is partially distended. Diffuse bladder wall thickening and mild enhancement at the bladder base. Bowel: Perirectal abscess is primarily about the left aspect of the rectum. This measures 4.9 x 4.1 x 3.0 cm. There is peripheral enhancement and inflammatory changes extending to the posterior gluteal cleft. Small subcentimeter component may extend in the right perirectal region inferiorly. No internal air. Pelvic bowel loops are otherwise unremarkable. Appendix is tentatively visualized and normal. Vascular/Lymphatic: Aorto bi-iliac atherosclerosis. No iliac aneurysm. Prominent lymph nodes in the right proximal external iliac station measuring 9 mm, series 5, image 29 in 35 there additional prominent lymph nodes in the left external iliac station to a lesser extent. Multiple prominent left external iliac nodes measuring up to 10 mm short axis. Small right perivesicular node measures 6 mm series 5, image 47, however is enhancing. Reproductive: Heterogeneously enhancing prostate gland that is prominent size and causes mass effect on the bladder base. Other:  No pelvic free fluid or intrapelvic fluid collection. Musculoskeletal: Degenerative change in the included  lower lumbar spine. Sclerotic focus in the left iliac bone and right superior pubic ramus. IMPRESSION: 1. Perirectal abscess primarily about the left aspect of the rectum measuring 4.9 x 4.1 x 3.0 cm. There is peripheral enhancement and inflammatory changes extending to the posterior gluteal cleft. 2. Heterogeneously enhancing prostate gland that causes mass effect on the bladder base. There is diffuse bladder wall thickening with enhancement of the bladder base. Recommend correlation with PSA. 3. Left inguinal nodes are likely reactive secondary to perirectal abscess. There also enlarged right external iliac nodes which would not be explained by rectal inflammation, and a small but enhancing 6 mm right perivesicular node. Metastatic disease/neoplasm is not excluded, particularly of the urinary bladder. Recommend urologic consultation for bladder and prostate evaluation. Cystoscopy may be of value. 4. Nonspecific sclerotic osseous foci in the left iliac bone and right superior pubic ramus. Aortic Atherosclerosis (ICD10-I70.0). Electronically Signed   By: Keith Rake M.D.   On: 03/08/2019 01:24    ROS - all of the below systems have been reviewed with the patient and positives are indicated with bold text General: chills, fever or night sweats Eyes: blurry vision or double vision ENT: epistaxis or sore throat Allergy/Immunology: itchy/watery eyes or nasal congestion Hematologic/Lymphatic: bleeding problems, blood clots or swollen lymph nodes Endocrine: temperature intolerance or unexpected weight changes Breast: new or changing breast lumps or nipple discharge Resp: cough, shortness of breath, or wheezing CV: chest pain or dyspnea on exertion GI: as per HPI GU: dysuria, trouble voiding, or hematuria MSK: joint pain or joint stiffness Neuro: TIA or stroke symptoms Derm: pruritus and skin lesion changes Psych: anxiety and depression  PE Blood pressure 134/80, pulse 88, temperature 99 F (37.2  C), temperature source Oral, resp. rate 16, SpO2 97 %. Constitutional: NAD; conversant; no deformities Eyes: Moist conjunctiva; no lid lag; anicteric; PERRL Neck: Trachea midline; no thyromegaly Lungs: Normal respiratory effort; no tactile fremitus CV: RRR; no palpable thrills; no pitting edema GI: Abd soft, NT/ND; no palpable hepatosplenomegaly Anorectal: Exam limited due to pt discomfort.  In the immediate vicinity of the perianal area, large fluctuant area consistent with abscess.  There is no clear emanating mass.  No active purulent drainage.  DRE/anoscopy not performed due to patient intolerance. MSK: no clubbing/cyanosis Psychiatric: Appropriate affect; alert and oriented x3 Lymphatic: No palpable cervical or axillary lymphadenopathy  Results for orders placed or  performed during the hospital encounter of 03/07/19 (from the past 48 hour(s))  Lactic acid, plasma     Status: None   Collection Time: 03/07/19  9:24 PM  Result Value Ref Range   Lactic Acid, Venous 1.0 0.5 - 1.9 mmol/L    Comment: Performed at [facility], [place] [address], [place], [place] [address]  Comprehensive metabolic panel     Status: Abnormal   Collection Time: 03/07/19  9:24 PM  Result Value Ref Range   Sodium 134 (L) 135 - 145 mmol/L   Potassium 4.3 3.5 - 5.1 mmol/L   Chloride 98 98 - 111 mmol/L   CO2 24 22 - 32 mmol/L   Glucose, Bld 121 (H) 70 - 99 mg/dL   BUN 7 6 - 20 mg/dL   Creatinine, Ser 0.88 0.61 - 1.24 mg/dL   Calcium 9.6 8.9 - 10.3 mg/dL   Total Protein 8.1 6.5 - 8.1 g/dL   Albumin 3.6 3.5 - 5.0 g/dL   AST 18 15 - 41 U/L   ALT 13 0 - 44 U/L   Alkaline Phosphatase 77 38 - 126 U/L   Total Bilirubin 1.3 (H) 0.3 - 1.2 mg/dL   GFR calc non Af Amer >60 >60 mL/min   GFR calc Af Amer >60 >60 mL/min   Anion gap 12 5 - 15    Comment: Performed at [facility] [address] [place], [place] [address]  CBC with Differential     Status: Abnormal   Collection Time: 03/07/19  9:24 PM    Result Value Ref Range   WBC 15.5 (H) 4.0 - 10.5 K/uL   RBC 4.88 4.22 - 5.81 MIL/uL   Hemoglobin 14.7 13.0 - 17.0 g/dL   HCT 43.6 39.0 - 52.0 %   MCV 89.3 80.0 - 100.0 fL   MCH 30.1 26.0 - 34.0 pg   MCHC 33.7 30.0 - 36.0 g/dL   RDW 12.9 11.5 - 15.5 %   Platelets 636 (H) 150 - 400 K/uL   nRBC 0.0 0.0 - 0.2 %   Neutrophils Relative % 75 %   Neutro Abs 11.6 (H) 1.7 - 7.7 K/uL   Lymphocytes Relative 13 %   Lymphs Abs 2.1 0.7 - 4.0 K/uL   Monocytes Relative 11 %   Monocytes Absolute 1.7 (H) 0.1 - 1.0 K/uL   Eosinophils Relative 0 %   Eosinophils Absolute 0.0 0.0 - 0.5 K/uL   Basophils Relative 0 %   Basophils Absolute 0.0 0.0 - 0.1 K/uL   Immature Granulocytes 1 %   Abs Immature Granulocytes 0.08 (H) 0.00 - 0.07 K/uL    Comment: Performed at [facility] [address] [place], [place] [address]  Respiratory Panel by RT PCR (Flu A&B, Covid) - Nasopharyngeal Swab     Status: None   Collection Time: 03/08/19  2:06 AM   Specimen: Nasopharyngeal Swab  Result Value Ref Range   SARS Coronavirus 2 by RT PCR NEGATIVE NEGATIVE    Comment: (NOTE) SARS-CoV-2 target nucleic acids are NOT DETECTED. The SARS-CoV-2 RNA is generally detectable in upper respiratoy specimens during the acute phase of infection. The lowest concentration of SARS-CoV-2 viral copies this assay can detect is 131 copies/mL. A negative result does not preclude SARS-Cov-2 infection and should not be used as the sole basis for treatment or other patient management decisions. A negative result may occur with  improper specimen collection/handling, submission of specimen other than nasopharyngeal swab, presence of viral  mutation(s) within the areas targeted by this assay, and inadequate number of viral copies (<131 copies/mL). A negative result must be combined with clinical observations, patient history, and epidemiological information. The expected result is Negative. Fact Sheet for Patients:   PinkCheek.be Fact Sheet for Healthcare Providers:  GravelBags.it This test is not yet ap proved or cleared by the [place] FDA and  has been authorized for detection and/or diagnosis of SARS-CoV-2 by FDA under an Emergency Use Authorization (EUA). This EUA will remain  in effect (meaning this test can be used) for the duration of the COVID-19 declaration under Section 564(b)(1) of the Act, 21 U.S.C. section 360bbb-3(b)(1), unless the authorization is terminated or revoked sooner.    Influenza A by PCR NEGATIVE NEGATIVE   Influenza B by PCR NEGATIVE NEGATIVE    Comment: (NOTE) The Xpert Xpress SARS-CoV-2/FLU/RSV assay is intended as an aid in  the diagnosis of influenza from Nasopharyngeal swab specimens and  should not be used as a sole basis for treatment. Nasal washings and  aspirates are unacceptable for Xpert Xpress SARS-CoV-2/FLU/RSV  testing. Fact Sheet for Patients: PinkCheek.be Fact Sheet for Healthcare Providers: GravelBags.it This test is not yet approved or cleared by the [place] FDA and  has been authorized for detection and/or diagnosis of SARS-CoV-2 by  FDA under an Emergency Use Authorization (EUA). This EUA will remain  in effect (meaning this test can be used) for the duration of the  Covid-19 declaration under Section 564(b)(1) of the Act, 21  U.S.C. section 360bbb-3(b)(1), unless the authorization is  terminated or revoked. Performed at [facility], [place] [address], [place], [place] [address]     CT PELVIS W CONTRAST  Result Date: 03/08/2019 CLINICAL DATA:  Evaluate perirectal abscess. Left buttock pain. Lymphadenopathy, inguinal/pelvic perirectal abscess EXAM: CT PELVIS WITH CONTRAST TECHNIQUE: Multidetector CT imaging of the pelvis was performed using the standard protocol following the bolus administration of intravenous  contrast. CONTRAST:  162mL OMNIPAQUE IOHEXOL 300 MG/ML  SOLN COMPARISON:  None. FINDINGS: Urinary Tract: Distal ureters are decompressed. Urinary bladder is partially distended. Diffuse bladder wall thickening and mild enhancement at the bladder base. Bowel: Perirectal abscess is primarily about the left aspect of the rectum. This measures 4.9 x 4.1 x 3.0 cm. There is peripheral enhancement and inflammatory changes extending to the posterior gluteal cleft. Small subcentimeter component may extend in the right perirectal region inferiorly. No internal air. Pelvic bowel loops are otherwise unremarkable. Appendix is tentatively visualized and normal. Vascular/Lymphatic: Aorto bi-iliac atherosclerosis. No iliac aneurysm. Prominent lymph nodes in the right proximal external iliac station measuring 9 mm, series 5, image 29 in 35 there additional prominent lymph nodes in the left external iliac station to a lesser extent. Multiple prominent left external iliac nodes measuring up to 10 mm short axis. Small right perivesicular node measures 6 mm series 5, image 47, however is enhancing. Reproductive: Heterogeneously enhancing prostate gland that is prominent size and causes mass effect on the bladder base. Other:  No pelvic free fluid or intrapelvic fluid collection. Musculoskeletal: Degenerative change in the included lower lumbar spine. Sclerotic focus in the left iliac bone and right superior pubic ramus. IMPRESSION: 1. Perirectal abscess primarily about the left aspect of the rectum measuring 4.9 x 4.1 x 3.0 cm. There is peripheral enhancement and inflammatory changes extending to the posterior gluteal cleft. 2. Heterogeneously enhancing prostate gland that causes mass effect on the bladder base. There is diffuse bladder wall thickening with enhancement of the  bladder base. Recommend correlation with PSA. 3. Left inguinal nodes are likely reactive secondary to perirectal abscess. There also enlarged right external iliac  nodes which would not be explained by rectal inflammation, and a small but enhancing 6 mm right perivesicular node. Metastatic disease/neoplasm is not excluded, particularly of the urinary bladder. Recommend urologic consultation for bladder and prostate evaluation. Cystoscopy may be of value. 4. Nonspecific sclerotic osseous foci in the left iliac bone and right superior pubic ramus. Aortic Atherosclerosis (ICD10-I70.0). Electronically Signed   By: Keith Rake M.D.   On: 03/08/2019 01:24    A/P: Colin Warner is an 58 y.o. male with hx HTN here with large perianal abscess  -The anatomy and physiology of the anal canal was discussed at length with the patient. The pathophysiology of anal abscess/fistula was discussed at length as well. -We reviewed options moving forward.  We discussed anorectal exam under anesthesia with incision and drainage of perianal abscess.  We discussed the technique in this procedure as well as the fact that there would be an open wound that would take multiple weeks to heal.  We discussed that there is the possibility of a drain being placed if the abscess is large.  We also discussed long-term that he could have a fistula present but that these are oftentimes occult and therefore difficult to identify at the index procedure with a large abscess cavity.  Without surgery, we discussed that risks include worsening of the symptoms and sepsis. -The planned procedure, material risks (including, but not limited to, pain, bleeding, infection, scarring, need for blood transfusion, damage to anal sphincter, incontinence of gas and/or stool, need for additional procedures, recurrent abscesses, pneumonia, heart attack, stroke, death) benefits and alternatives to surgery were discussed at length. I noted a good probability that the procedure would help improve his symptoms. The patient's questions were answered to his satisfaction, he voiced understanding and elected to proceed with  surgery. Additionally, we discussed typical postoperative expectations and the recovery process.   Sharon Mt. Dema Severin, M.D. [facility] Use [email or web address] to contact on call provider

## 2019-03-08 NOTE — Op Note (Signed)
03/08/2019  5:24 AM  PATIENT:  Colin Warner  58 y.o. male  Patient Care Team: Charlott Rakes, MD as PCP - General (Family Medicine)  PRE-OPERATIVE DIAGNOSIS:  Perianal abscess  POST-OPERATIVE DIAGNOSIS:  Same  PROCEDURE:   1. Incision and drainage of perianal abscess 2. Anorectal exam under anesthesia  SURGEON:  Surgeon(s): Ileana Roup, MD  ASSISTANT: none   ANESTHESIA:   general  SPECIMEN:  Cultures of pus  DISPOSITION OF SPECIMEN:  Lab  COUNTS:  Sponge, needle, and instrument counts were reported correct x2 at conclusion.  EBL: 5 mL  Drains: None  PLAN OF CARE: Admit for observation  PATIENT DISPOSITION:  PACU - hemodynamically stable.  INDICATION: 89yoM with hx of HTN whom who to the emergency room with a 4-day history of progressively worsening perianal pain.  He had evidence of a perianal abscess on exam. He had a leukocytosis. The ED team ordered CT scan which demonstrated the same. We were asked to see - please refer to notes elsewhere for details regarding this discussion - he opted to proceed with surgery.  OR FINDINGS: Left posterolateral abscess which was ~3 x 3 cm in size and close to anal opening - in intersphincteric plane. This was opened as far from muscle as possible.  Culture sent.  Wound was landscape to facilitate drainage.  A single 4 x 4 gauze was placed as packing to maintain hemostasis.  DESCRIPTION: The patient was identified in the preoperative holding area and taken to the OR where he was placed on the operating room table. SCDs were placed.  General anesthesia was induced without difficulty. The patient was then positioned in high lithotomy using Allen stirrups. He was then prepped and draped in usual sterile fashion.  A surgical timeout was performed indicating the correct patient, procedure, positioning and need for preoperative antibiotics.  A well lubricated digital rectal exam was performed which demonstrated normal anal  canal/distal rectum without palpable masses.  A Hill-Ferguson anoscope was into the anal canal and circumferential inspection demonstrated normal-appearing anoderm without ulceration or any obvious internal opening.  He did have an abscess in the left posterior lateral position and this was really close to the anal opening.  As far away from the anal opening is possible, a cruciate incision was created.  The corners were trimmed to facilitate drainage.  Approximately 15-20 cc of pus freely drained.  Cultures were sent.  All loculations were broken up bluntly.  This abscess appeared to lie within his intersphincteric plane.  The wound was then irrigated with copious saline.  Hemostasis was achieved with electrocautery.  No other areas of abscess were identified.  A moist 4 x 4 gauze was placed into the abscess cavity to assist with maintenance of hemostasis.  A dressing consisting of 4 x 4 gauze, ABD pad, and mesh underwear were placed.  He was then taken out of the lithotomy position, awakened from anesthesia, extubated, and transferred to stretcher for transport to PACU in satisfactory condition.  DISPOSITION: PACU in satisfactory condition.

## 2019-03-08 NOTE — Anesthesia Procedure Notes (Signed)
Procedure Name: Intubation Date/Time: 03/08/2019 4:53 AM Performed by: Valetta Fuller, CRNA Pre-anesthesia Checklist: Patient identified, Emergency Drugs available, Suction available and Patient being monitored Patient Re-evaluated:Patient Re-evaluated prior to induction Oxygen Delivery Method: Circle system utilized Preoxygenation: Pre-oxygenation with 100% oxygen Induction Type: IV induction and Rapid sequence Laryngoscope Size: Miller and 2 Grade View: Grade II Tube size: 7.5 mm Number of attempts: 1 Airway Equipment and Method: Stylet Placement Confirmation: ETT inserted through vocal cords under direct vision,  positive ETCO2 and breath sounds checked- equal and bilateral Secured at: 24 cm Tube secured with: Tape Dental Injury: Teeth and Oropharynx as per pre-operative assessment

## 2019-03-08 NOTE — Discharge Instructions (Signed)
Anorectal Abscess An abscess is an infected area that contains a collection of pus. An anorectal abscess is an abscess that is near the opening of the anus or around the rectum. Without treatment, an anorectal abscess can become larger and cause other problems, such as a more serious body-wide infection or pain, especially during bowel movements. What are the causes? This condition is caused by plugged glands or an infection in one of these areas:  The anus.  The area between the anus and the scrotum in males or between the anus and the vagina in females (perineum). What increases the risk? The following factors may make you more likely to develop this condition:  Diabetes or inflammatory bowel disease.  Having a body defense system (immune system) that is weak.  Engaging in anal sex.  Having a sexually transmitted infection (STI).  Certain kinds of cancer, such as rectal carcinoma, leukemia, or lymphoma. What are the signs or symptoms? The main symptom of this condition is pain. The pain may be a throbbing pain that gets worse during bowel movements. Other symptoms include:  Swelling and redness in the area of the abscess. The redness may go beyond the abscess and appear as a red streak on the skin.  A visible, painful lump, or a lump that can be felt when touched.  Bleeding or pus-like discharge from the area.  Fever.  General weakness.  Constipation.  Diarrhea. How is this diagnosed? This condition is diagnosed based on your medical history and a physical exam of the affected area.  This may involve examining the rectal area with a gloved hand (digital rectal exam).  Sometimes, the health care provider needs to look into the rectum using a probe, scope, or imaging test.  For women, it may require a careful vaginal exam. How is this treated? Treatment for this condition may include:  Incision and drainage surgery. This involves making an incision over the abscess to  drain the pus.  Medicines, including antibiotic medicine, pain medicine, stool softeners, or laxatives. Follow these instructions at home: Medicines  Take over-the-counter and prescription medicines only as told by your health care provider.  If you were prescribed an antibiotic medicine, use it as told by your health care provider. Do not stop using the antibiotic even if you start to feel better.  Do not drive or use heavy machinery while taking prescription pain medicine. Wound care   If gauze was used in the abscess, follow instructions from your health care provider about removing or changing the gauze. It can usually be removed in 2-3 days.  Wash your hands with soap and water before you remove or change your gauze. If soap and water are not available, use hand sanitizer.  If one or more drains were placed in the abscess cavity, be careful not to pull at them. Your health care provider will tell you how long they need to remain in place.  Check your incision area every day for signs of infection. Check for: ? More redness, swelling, or pain. ? More fluid or blood. ? Warmth. ? Pus or a bad smell. Managing pain, stiffness, and swelling   Take a sitz bath 3-4 times a day and after bowel movements. This will help reduce pain and swelling.  To relieve pain, try sitting: ? On a heating pad with the setting on low. ? On an inflatable donut-shaped cushion.  If directed, put ice on the affected area: ? Put ice in a plastic bag. ? Place   a towel between your skin and the bag. ? Leave the ice on for 20 minutes, 2-3 times a day. General instructions  Follow any diet instructions given by your health care provider.  Keep all follow-up visits as told by your health care provider. This is important. Contact a health care provider if you have:  Bleeding from your incision.  Pain, swelling, or redness that does not improve or gets worse.  Trouble passing stool or  urine.  Symptoms that return after treatment. Get help right away if you:  Have problems moving or using your legs.  Have severe or increasing pain.  Have swelling in the affected area that suddenly gets worse.  Have a large increase in bleeding or passing of pus.  Develop chills or a fever. Summary  An anorectal abscess is an abscess that is near the opening of the anus or around the rectum. An abscess is an infected area that contains a collection of pus.  The main symptom of this condition is pain. It may be a throbbing pain that gets worse during bowel movements.  Treatment for an anorectal abscess may include surgery to drain the pus from the abscess. Medicines and sitz baths may also be a part of your treatment plan. This information is not intended to replace advice given to you by your health care provider. Make sure you discuss any questions you have with your health care provider. Document Released: 03/11/2000 Document Revised: 04/20/2017 Document Reviewed: 04/20/2017 Elsevier Patient Education  2020 [employer].   How to Take a [employer] A sitz bath is a warm water bath that may be used to care for your rectum, genital area, or the area between your rectum and genitals (perineum). For a sitz bath, the water only comes up to your hips and covers your buttocks. A sitz bath may done at home in a bathtub or with a portable sitz bath that fits over the toilet. Your health care provider may recommend a sitz bath to help:  Relieve pain and discomfort after delivering a baby.  Relieve pain and itching from hemorrhoids or anal fissures.  Relieve pain after certain surgeries.  Relax muscles that are sore or tight. How to take a sitz bath Take 3-4 sitz baths a day, or as many as told by your health care provider. Bathtub sitz bath To take a sitz bath in a bathtub: 1. Partially fill a bathtub with warm water. The water should be deep enough to cover your hips and buttocks  when you are sitting in the tub. 2. If your health care provider told you to put medicine in the water, follow his or her instructions. 3. Sit in the water. 4. Open the tub drain a little, and leave it open during your bath. 5. Turn on the warm water again, enough to replace the water that is draining out. Keep the water running throughout your bath. This helps keep the water at the right level and the right temperature. 6. Soak in the water for 15-20 minutes, or as long as told by your health care provider. 7. When you are done, be careful when you stand up. You may feel dizzy. 8. After the sitz bath, pat yourself dry. Do not rub your skin to dry it.  Over-the-toilet sitz bath To take a sitz bath with an over-the-toilet basin: 1. Follow the manufacturer's instructions. 2. Fill the basin with warm water. 3. If your health care provider told you to put medicine in the  water, follow his or her instructions. 4. Sit on the seat. Make sure the water covers your buttocks and perineum. 5. Soak in the water for 15-20 minutes, or as long as told by your health care provider. 6. After the sitz bath, pat yourself dry. Do not rub your skin to dry it. 7. Clean and dry the basin between uses. 8. Discard the basin if it cracks, or according to the manufacturer's instructions. Contact a health care provider if:  Your symptoms get worse. Do not continue with sitz baths if your symptoms get worse.  You have new symptoms. If this happens, do not continue with sitz baths until you talk with your health care provider. Summary  A sitz bath is a warm water bath in which the water only comes up to your hips and covers your buttocks.  A sitz bath may help relieve itching, relieve pain, and relax muscles that are sore or tight in the lower part of your body, including your genital area.  Take 3-4 sitz baths a day, or as many as told by your health care provider. Soak in the water for 15-20 minutes.  Do not  continue with sitz baths if your symptoms get worse. This information is not intended to replace advice given to you by your health care provider. Make sure you discuss any questions you have with your health care provider. Document Released: 12/05/2003 Document Revised: 03/16/2017 Document Reviewed: 03/16/2017 Elsevier Patient Education  2020 [employer].

## 2019-03-08 NOTE — Discharge Summary (Signed)
[facility] Discharge Summary   Patient ID: Colin Warner MRN: [id number] DOB/AGE: Jun 12, 1960 58 y.o.  Admit date: 03/07/2019 Discharge date: 03/08/2019  Admitting Diagnosis: Perirectal abscess  Discharge Diagnosis Perirectal abscess  Consultants None  Imaging: CT PELVIS W CONTRAST  Result Date: 03/08/2019 CLINICAL DATA:  Evaluate perirectal abscess. Left buttock pain. Lymphadenopathy, inguinal/pelvic perirectal abscess EXAM: CT PELVIS WITH CONTRAST TECHNIQUE: Multidetector CT imaging of the pelvis was performed using the standard protocol following the bolus administration of intravenous contrast. CONTRAST:  143mL OMNIPAQUE IOHEXOL 300 MG/ML  SOLN COMPARISON:  None. FINDINGS: Urinary Tract: Distal ureters are decompressed. Urinary bladder is partially distended. Diffuse bladder wall thickening and mild enhancement at the bladder base. Bowel: Perirectal abscess is primarily about the left aspect of the rectum. This measures 4.9 x 4.1 x 3.0 cm. There is peripheral enhancement and inflammatory changes extending to the posterior gluteal cleft. Small subcentimeter component may extend in the right perirectal region inferiorly. No internal air. Pelvic bowel loops are otherwise unremarkable. Appendix is tentatively visualized and normal. Vascular/Lymphatic: Aorto bi-iliac atherosclerosis. No iliac aneurysm. Prominent lymph nodes in the right proximal external iliac station measuring 9 mm, series 5, image 29 in 35 there additional prominent lymph nodes in the left external iliac station to a lesser extent. Multiple prominent left external iliac nodes measuring up to 10 mm short axis. Small right perivesicular node measures 6 mm series 5, image 47, however is enhancing. Reproductive: Heterogeneously enhancing prostate gland that is prominent size and causes mass effect on the bladder base. Other:  No pelvic free fluid or intrapelvic fluid collection. Musculoskeletal: Degenerative change  in the included lower lumbar spine. Sclerotic focus in the left iliac bone and right superior pubic ramus. IMPRESSION: 1. Perirectal abscess primarily about the left aspect of the rectum measuring 4.9 x 4.1 x 3.0 cm. There is peripheral enhancement and inflammatory changes extending to the posterior gluteal cleft. 2. Heterogeneously enhancing prostate gland that causes mass effect on the bladder base. There is diffuse bladder wall thickening with enhancement of the bladder base. Recommend correlation with PSA. 3. Left inguinal nodes are likely reactive secondary to perirectal abscess. There also enlarged right external iliac nodes which would not be explained by rectal inflammation, and a small but enhancing 6 mm right perivesicular node. Metastatic disease/neoplasm is not excluded, particularly of the urinary bladder. Recommend urologic consultation for bladder and prostate evaluation. Cystoscopy may be of value. 4. Nonspecific sclerotic osseous foci in the left iliac bone and right superior pubic ramus. Aortic Atherosclerosis (ICD10-I70.0). Electronically Signed   By: Keith Rake M.D.   On: 03/08/2019 01:24    Procedures Dr. Dema Severin (03/08/19) - incision and drainage  Hospital Course:  Patient is a 58 year old male who presented to [facility] with perianal pain.  Workup showed perirectal abscess.  Patient was admitted and underwent procedure listed above.  Tolerated procedure well and was transferred to the floor.  Diet was advanced as tolerated.  On POD#0, the patient was voiding well, tolerating diet, ambulating well, pain well controlled, vital signs stable, incisions c/d/i and felt stable for discharge home.  Patient will follow up in our office in 2 weeks and knows to call with questions or concerns.  He will call to confirm appointment date/time.    Physical Exam: General:  Alert, NAD, pleasant, comfortable Abd:  Soft, ND, NT GU: packing removed from perirectal wound with some bloody  drainage  Allergies as of 03/08/2019   No Known Allergies  Medication List    TAKE these medications   acetaminophen 500 MG tablet Commonly known as: TYLENOL Take 1 tablet (500 mg total) by mouth every 6 (six) hours as needed.   amoxicillin-clavulanate 875-125 MG tablet Commonly known as: AUGMENTIN Take 1 tablet by mouth 2 (two) times daily for 7 days.   lisinopril-hydrochlorothiazide 20-25 MG tablet Commonly known as: ZESTORETIC Take 1 tablet by mouth daily.   naproxen 500 MG tablet Commonly known as: Naprosyn Take 1 tablet (500 mg total) by mouth 2 (two) times daily with a meal.   omeprazole 20 MG capsule Commonly known as: PriLOSEC Take 1 capsule (20 mg total) by mouth daily.   simvastatin 20 MG tablet Commonly known as: ZOCOR Take 1 tablet (20 mg total) by mouth at bedtime.   traMADol 50 MG tablet Commonly known as: ULTRAM Take 1 tablet (50 mg total) by mouth every 6 (six) hours as needed for severe pain.        Follow-up Information    [facility], [facility]. Go on 03/25/2019.   Specialty: General Surgery Why: Follow up appointment scheduled for 1:30 PM. Please arrive 30 min prior to appointment time. Bring photo ID and insurance information.  Contact information: [address] [place] [address] [phone]           Signed: Brigid Re, [facility] 03/08/2019, 3:10 PM Please see Amion for pager number during day hours 7:00am-4:30pm

## 2019-03-08 NOTE — ED Provider Notes (Signed)
[facility] EMERGENCY DEPARTMENT Provider Note   CSN: [id number] Arrival date & time: 03/07/19  2037     History Chief Complaint  Patient presents with  . Abscess    Colin Warner is a 58 y.o. male.  Patient is a 58 year old male with past medical history of hypertension, GI bleed, and alcohol abuse.  He presents today for evaluation of rectal pain and swelling.  This started 4 days ago and is rapidly worsening.  Patient describes swelling and severe pain next to his rectum.  He has pain with bowel movement, sitting.  He denies fever.  There are no alleviating factors.  The history is provided by the patient.  Abscess Abscess location: buttock. Abscess quality: induration and painful   Progression:  Worsening Pain details:    Quality:  Throbbing   Severity:  Severe   Timing:  Constant Chronicity:  New Relieved by:  Nothing Ineffective treatments:  None tried      Past Medical History:  Diagnosis Date  . Hypertension     Patient Active Problem List   Diagnosis Date Noted  . Hyperlipidemia 08/04/2017  . Acute upper GI bleed   . GI bleed 12/13/2015  . Essential hypertension 12/13/2015  . Acute blood loss anemia 12/13/2015  . Left knee pain 12/13/2015  . Alcohol use disorder, moderate, dependence ([place]) 12/13/2015    Past Surgical History:  Procedure Laterality Date  . ESOPHAGOGASTRODUODENOSCOPY N/A 12/14/2015   Procedure: ESOPHAGOGASTRODUODENOSCOPY (EGD);  Surgeon: Wonda Horner, MD;  Location: [facility] ENDOSCOPY;  Service: Endoscopy;  Laterality: N/A;  . Urologic surgery, unknown, for hematuria          Family History  Problem Relation Age of Onset  . Hypertension Mother   . Chronic Renal Failure Mother   . Alcoholism Father     Social History   Tobacco Use  . Smoking status: Current Every Day Smoker    Packs/day: 0.50    Types: Cigarettes  . Smokeless tobacco: Never Used  . Tobacco comment: 5-6 daily  Substance Use Topics  .  Alcohol use: Yes    Comment: 2 months ago  . Drug use: No    Home Medications Prior to Admission medications   Medication Sig Start Date End Date Taking? Authorizing Provider  lisinopril-hydrochlorothiazide (ZESTORETIC) 20-25 MG tablet Take 1 tablet by mouth daily. 12/06/18   Argentina Donovan, PA-C  naproxen (NAPROSYN) 500 MG tablet Take 1 tablet (500 mg total) by mouth 2 (two) times daily with a meal. 12/06/18   McClung, Dionne Bucy, PA-C  omeprazole (PRILOSEC) 20 MG capsule Take 1 capsule (20 mg total) by mouth daily. 12/06/18   Argentina Donovan, PA-C  simvastatin (ZOCOR) 20 MG tablet Take 1 tablet (20 mg total) by mouth at bedtime. 12/06/18   Argentina Donovan, PA-C    Allergies    Patient has no known allergies.  Review of Systems   Review of Systems  All other systems reviewed and are negative.   Physical Exam Updated Vital Signs BP (!) 182/137   Pulse (!) 128   Temp 99 F (37.2 C) (Oral)   Resp 20   SpO2 100%   Physical Exam Vitals and nursing note reviewed.  Constitutional:      General: He is not in acute distress.    Appearance: He is well-developed. He is not diaphoretic.  HENT:     Head: Normocephalic and atraumatic.  Cardiovascular:     Rate and Rhythm: Normal rate and regular  rhythm.     Heart sounds: No murmur. No friction rub.  Pulmonary:     Effort: Pulmonary effort is normal. No respiratory distress.     Breath sounds: Normal breath sounds. No wheezing or rales.  Abdominal:     General: Bowel sounds are normal. There is no distension.     Palpations: Abdomen is soft.     Tenderness: There is no abdominal tenderness.  Genitourinary:    Comments: There is a large, perirectal abscess noted.  His rectum is exquisitely tender to the touch and precludes full exam. Musculoskeletal:        General: Normal range of motion.     Cervical back: Normal range of motion and neck supple.  Skin:    General: Skin is warm and dry.  Neurological:     Mental Status: He  is alert and oriented to person, place, and time.     Coordination: Coordination normal.     ED Results / Procedures / Treatments   Labs (all labs ordered are listed, but only abnormal results are displayed) Labs Reviewed  COMPREHENSIVE METABOLIC PANEL - Abnormal; Notable for the following components:      Result Value   Sodium 134 (*)    Glucose, Bld 121 (*)    Total Bilirubin 1.3 (*)    All other components within normal limits  CBC WITH DIFFERENTIAL/PLATELET - Abnormal; Notable for the following components:   WBC 15.5 (*)    Platelets 636 (*)    Neutro Abs 11.6 (*)    Monocytes Absolute 1.7 (*)    Abs Immature Granulocytes 0.08 (*)    All other components within normal limits  LACTIC ACID, PLASMA  LACTIC ACID, PLASMA    EKG None  Radiology No results found.  Procedures Procedures (including critical care time)  Medications Ordered in ED Medications  sodium chloride flush (NS) 0.9 % injection 3 mL (has no administration in time range)  HYDROmorphone (DILAUDID) injection 1 mg (has no administration in time range)    ED Course  I have reviewed the triage vital signs and the nursing notes.  Pertinent labs & imaging results that were available during my care of the patient were reviewed by me and considered in my medical decision making (see chart for details).  Patient presenting here with complaints of severe rectal pain, worsening over the past 4 days.  Exam difficult secondary to degree of discomfort, but he appears to have a large perirectal abscess.  Patient sent for a CT scan after receiving medicine for pain.  This showed a perirectal abscess.  MDM Rules/Calculators/A&P  Patient given additional pain medication and Zosyn.  Due to the degree of discomfort, I feel as though this will require drainage under general anesthesia.  I have discussed the care with Dr. Dema Severin from general surgery.  He will evaluate and likely take to the operating room.  Final Clinical  Impression(s) / ED Diagnoses Final diagnoses:  None    Rx / DC Orders ED Discharge Orders    None       Veryl Speak, MD 03/08/19 0210

## 2019-03-08 NOTE — ED Notes (Signed)
Report given to [place] in [place]

## 2019-03-08 NOTE — Anesthesia Postprocedure Evaluation (Signed)
Anesthesia Post Note  Patient: Colin Warner  Procedure(s) Performed: IRRIGATION AND DEBRIDEMENT PERINEAL ABSCESS EXAM UNDER ANESTHESIA (N/A Ankle)     Patient location during evaluation: PACU Anesthesia Type: General Level of consciousness: awake and alert Pain management: pain level controlled Vital Signs Assessment: post-procedure vital signs reviewed and stable Respiratory status: spontaneous breathing, nonlabored ventilation, respiratory function stable and patient connected to nasal cannula oxygen Cardiovascular status: blood pressure returned to baseline and stable Postop Assessment: no apparent nausea or vomiting Anesthetic complications: no    Last Vitals:  Vitals:   03/08/19 0605 03/08/19 0615  BP:  137/88  Pulse: 79 81  Resp: (!) 30 (!) 29  Temp:    SpO2: 95% 95%    Last Pain:  Vitals:   03/08/19 0615  TempSrc:   PainSc: Asleep                 Catalina Gravel

## 2019-03-11 LAB — AEROBIC/ANAEROBIC CULTURE W GRAM STAIN (SURGICAL/DEEP WOUND)

## 2019-03-20 MED FILL — LISINOPRIL-HCTZ 20-25 MG TA: 20-25 | 30 days supply | Qty: 30 | Fill #2

## 2019-03-20 MED FILL — SIMVASTATIN 20 MG TABLET: 20 | 30 days supply | Qty: 30 | Fill #2

## 2019-03-20 MED FILL — NAPROXEN 500 MG TABLET: 500 | 15 days supply | Qty: 30 | Fill #2

## 2019-03-20 MED FILL — OMEPRAZOLE 20 MG CAP: 20 | 30 days supply | Qty: 30 | Fill #2

## 2019-04-09 ENCOUNTER — Other Ambulatory Visit: Payer: Self-pay | Admitting: Urology

## 2019-04-09 MED ORDER — FLEET ENEMA 7-19 GM/118ML RE ENEM: ENEMA | RECTAL | 0 refills | Status: DC

## 2019-04-09 MED ORDER — SULFAMETHOXAZOLE-TRIMETHOPRIM 800-160 MG PO TABS: 1.0000 | ORAL_TABLET | Freq: Once | ORAL | 0 refills | Status: AC

## 2019-04-09 MED FILL — SULFAMETHOXAZOLE-TMP DS TAB: 800-160 | 1 days supply | Qty: 1 | Fill #0

## 2019-04-15 ENCOUNTER — Other Ambulatory Visit: Payer: Self-pay | Admitting: Physician Assistant

## 2019-04-15 DIAGNOSIS — M25562 Pain in left knee: Secondary | ICD-10-CM

## 2019-04-15 DIAGNOSIS — G8929 Other chronic pain: Secondary | ICD-10-CM

## 2019-04-15 MED FILL — SIMVASTATIN 20 MG TABLET: 20 | 30 days supply | Qty: 30 | Fill #3

## 2019-04-15 MED FILL — OMEPRAZOLE 20 MG CAP: 20 | 30 days supply | Qty: 30 | Fill #3

## 2019-04-15 MED FILL — LISINOPRIL-HCTZ 20-25 MG TA: 20-25 | 30 days supply | Qty: 30 | Fill #3

## 2019-04-17 ENCOUNTER — Telehealth: Payer: Self-pay | Admitting: Urology

## 2019-04-17 MED ORDER — FLEET ENEMA 7-19 GM/118ML RE ENEM: 1.0000 | ENEMA | Freq: Once | RECTAL | 0 refills | Status: AC

## 2019-04-17 MED ORDER — LEVOFLOXACIN 500 MG PO TABS: 500.0000 mg | ORAL_TABLET | Freq: Once | ORAL | 0 refills | Status: AC

## 2019-04-17 NOTE — Telephone Encounter (Signed)
Enema and Levaquin sent to [facility] in [place]

## 2019-04-18 MED FILL — SULFAMETHOXAZOLE-TMP DS TAB: 800-160 | 1 days supply | Qty: 1 | Fill #0

## 2019-04-21 NOTE — Telephone Encounter (Signed)
Error

## 2019-05-01 ENCOUNTER — Telehealth: Payer: Self-pay | Admitting: Oncology

## 2019-05-01 NOTE — Telephone Encounter (Signed)
Received a new patient referral from Dr. Louis Meckel at [facility] for dx of metastatic prostate cancer. I received a call from Dr. Tharon Aquas to schedule a new pt appt for Mr. Colin Warner to see Dr. Alen Blew on 2/5 at 2pm. Dr. Anthony Sar will notify the pt of the appt when he's seen in his office on 2/4.

## 2019-05-02 ENCOUNTER — Other Ambulatory Visit: Payer: Self-pay | Admitting: Urology

## 2019-05-02 ENCOUNTER — Other Ambulatory Visit (HOSPITAL_COMMUNITY): Payer: Self-pay | Admitting: Urology

## 2019-05-02 DIAGNOSIS — C61 Malignant neoplasm of prostate: Secondary | ICD-10-CM

## 2019-05-03 ENCOUNTER — Inpatient Hospital Stay: Payer: Self-pay | Attending: Oncology | Admitting: Oncology

## 2019-05-03 ENCOUNTER — Telehealth: Payer: Self-pay | Admitting: Oncology

## 2019-05-03 ENCOUNTER — Other Ambulatory Visit: Payer: Self-pay

## 2019-05-03 VITALS — BP 164/108 | HR 79 | Temp 99.1°F | Resp 17 | Ht 65.0 in | Wt 135.8 lb

## 2019-05-03 DIAGNOSIS — Z841 Family history of disorders of kidney and ureter: Secondary | ICD-10-CM

## 2019-05-03 DIAGNOSIS — C7951 Secondary malignant neoplasm of bone: Secondary | ICD-10-CM

## 2019-05-03 DIAGNOSIS — Z8249 Family history of ischemic heart disease and other diseases of the circulatory system: Secondary | ICD-10-CM

## 2019-05-03 DIAGNOSIS — Z811 Family history of alcohol abuse and dependence: Secondary | ICD-10-CM

## 2019-05-03 DIAGNOSIS — F17210 Nicotine dependence, cigarettes, uncomplicated: Secondary | ICD-10-CM

## 2019-05-03 DIAGNOSIS — Z79899 Other long term (current) drug therapy: Secondary | ICD-10-CM

## 2019-05-03 DIAGNOSIS — G893 Neoplasm related pain (acute) (chronic): Secondary | ICD-10-CM

## 2019-05-03 DIAGNOSIS — R590 Localized enlarged lymph nodes: Secondary | ICD-10-CM

## 2019-05-03 DIAGNOSIS — C61 Malignant neoplasm of prostate: Secondary | ICD-10-CM

## 2019-05-03 NOTE — Telephone Encounter (Signed)
Scheduled appt per 2/5 los.  Sent a message to HIM pool to get a calendar mailed out,

## 2019-05-03 NOTE — Progress Notes (Signed)
Reason for the request:    Prostate cancer  HPI: I was asked by Dr. Louis Meckel to evaluate Colin Warner for the evaluation of prostate cancer.  He is a 59 year old man presented with perirectal abscess in December 2020.  Imaging studies at that time of the pelvis showed enlargement of the prostate gland with diffuse bladder wall thickening as well as left inguinal lymph nodes that are enlarged.  There is also nonspecific sclerotic osseous foci of the left iliac bone and the right superior pubic ramus.  Based on these findings he was evaluated by Dr. Louis Meckel and a PSA at that time was noted to be elevated at 556.  He underwent a prostate biopsy on April 25, 2019 which showed a Gleason score of 5+4 = 9 predominantly with 12 out of 12 cores involved.  He is scheduled to have a staging bone scan in the near future.  He is also considering androgen deprivation therapy at this time.  Clinically he is reporting some pelvic discomfort and back pain a lot of it has to do with the nature of his job.  Appetite remains reasonable and continues to smoke and drink periodically.     He does not report any headaches, blurry vision, syncope or seizures. Does not report any fevers, chills or sweats.  Does not report any cough, wheezing or hemoptysis.  Does not report any chest pain, palpitation, orthopnea or leg edema.  Does not report any nausea, vomiting or abdominal pain.  Does not report any constipation or diarrhea.  Does not report any skeletal complaints.    Does not report frequency, urgency or hematuria.  Does not report any skin rashes or lesions. Does not report any heat or cold intolerance.  Does not report any lymphadenopathy or petechiae.  Does not report any anxiety or depression.  Remaining review of systems is negative.    Past Medical History:  Diagnosis Date  . Hypertension   :  Past Surgical History:  Procedure Laterality Date  . ESOPHAGOGASTRODUODENOSCOPY N/A 12/14/2015   Procedure:  ESOPHAGOGASTRODUODENOSCOPY (EGD);  Surgeon: Wonda Horner, MD;  Location: [facility] ENDOSCOPY;  Service: Endoscopy;  Laterality: N/A;  . INCISION AND DRAINAGE PERIRECTAL ABSCESS N/A 03/08/2019   Procedure: IRRIGATION AND DEBRIDEMENT PERINEAL ABSCESS EXAM UNDER ANESTHESIA;  Surgeon: Ileana Roup, MD;  Location: [facility];  Service: General;  Laterality: N/A;  . Urologic surgery, unknown, for hematuria     :   Current Outpatient Medications:  .  acetaminophen (TYLENOL) 500 MG tablet, Take 1 tablet (500 mg total) by mouth every 6 (six) hours as needed., Disp: , Rfl:  .  lisinopril-hydrochlorothiazide (ZESTORETIC) 20-25 MG tablet, Take 1 tablet by mouth daily., Disp: 90 tablet, Rfl: 1 .  naproxen (NAPROSYN) 500 MG tablet, Take 1 tablet (500 mg total) by mouth 2 (two) times daily with a meal., Disp: 30 tablet, Rfl: 2 .  omeprazole (PRILOSEC) 20 MG capsule, Take 1 capsule (20 mg total) by mouth daily., Disp: 30 capsule, Rfl: 6 .  simvastatin (ZOCOR) 20 MG tablet, Take 1 tablet (20 mg total) by mouth at bedtime., Disp: 90 tablet, Rfl: 1 .  sodium phosphate (FLEET) 7-19 GM/118ML ENEM, Take morning of biopsy, Disp: 1 enema, Rfl: 0 .  traMADol (ULTRAM) 50 MG tablet, Take 1 tablet (50 mg total) by mouth every 6 (six) hours as needed for severe pain., Disp: 10 tablet, Rfl: 0:  No Known Allergies:  Family History  Problem Relation Age of Onset  . Hypertension Mother   .  Chronic Renal Failure Mother   . Alcoholism Father   :  Social History   Socioeconomic History  . Marital status: Married    Spouse name: Not on file  . Number of children: Not on file  . Years of education: Not on file  . Highest education level: Not on file  Occupational History  . Not on file  Tobacco Use  . Smoking status: Current Every Day Smoker    Packs/day: 0.50    Types: Cigarettes  . Smokeless tobacco: Never Used  . Tobacco comment: 5-6 daily  Substance and Sexual Activity  . Alcohol use: Yes    Comment: 2 months  ago  . Drug use: No  . Sexual activity: Never    Birth control/protection: None  Other Topics Concern  . Not on file  Social History Narrative  . Not on file   Social Determinants of Health   Financial Resource Strain:   . Difficulty of Paying Living Expenses: Not on file  Food Insecurity:   . Worried About Charity fundraiser in the Last Year: Not on file  . Ran Out of Food in the Last Year: Not on file  Transportation Needs:   . Lack of Transportation (Medical): Not on file  . Lack of Transportation (Non-Medical): Not on file  Physical Activity:   . Days of Exercise per Week: Not on file  . Minutes of Exercise per Session: Not on file  Stress:   . Feeling of Stress : Not on file  Social Connections:   . Frequency of Communication with Friends and Family: Not on file  . Frequency of Social Gatherings with Friends and Family: Not on file  . Attends Religious Services: Not on file  . Active Member of Clubs or Organizations: Not on file  . Attends Archivist Meetings: Not on file  . Marital Status: Not on file  Intimate Partner Violence:   . Fear of Current or Ex-Partner: Not on file  . Emotionally Abused: Not on file  . Physically Abused: Not on file  . Sexually Abused: Not on file  :   Exam: Blood pressure (!) 164/108, pulse 79, temperature 99.1 F (37.3 C), temperature source Temporal, resp. rate 17, height 5\' 5"  (1.651 m), weight 135 lb 12.8 oz (61.6 kg), SpO2 100 %.  ECOG 1  General appearance: alert and cooperative appeared without distress. Head: atraumatic without any abnormalities. Eyes: conjunctivae/corneas clear. PERRL.  Sclera anicteric. Throat: lips, mucosa, and tongue normal; without oral thrush or ulcers. Resp: clear to auscultation bilaterally without rhonchi, wheezes or dullness to percussion. Cardio: regular rate and rhythm, S1, S2 normal, no murmur, click, rub or gallop GI: soft, non-tender; bowel sounds normal; no masses,  no  organomegaly Skin: Skin color, texture, turgor normal. No rashes or lesions Lymph nodes: Cervical, supraclavicular, and axillary nodes normal. Neurologic: Grossly normal without any motor, sensory or deep tendon reflexes. Musculoskeletal: No joint deformity or effusion.  CBC    Component Value Date/Time   WBC 15.5 (H) 03/07/2019 2124   RBC 4.88 03/07/2019 2124   HGB 14.7 03/07/2019 2124   HCT 43.6 03/07/2019 2124   PLT 636 (H) 03/07/2019 2124   MCV 89.3 03/07/2019 2124   MCH 30.1 03/07/2019 2124   MCHC 33.7 03/07/2019 2124   RDW 12.9 03/07/2019 2124   LYMPHSABS 2.1 03/07/2019 2124   MONOABS 1.7 (H) 03/07/2019 2124   EOSABS 0.0 03/07/2019 2124   BASOSABS 0.0 03/07/2019 2124  Chemistry      Component Value Date/Time   NA 134 (L) 03/07/2019 2124   NA 140 08/04/2017 1641   K 4.3 03/07/2019 2124   CL 98 03/07/2019 2124   CO2 24 03/07/2019 2124   BUN 7 03/07/2019 2124   BUN 13 08/04/2017 1641   CREATININE 0.88 03/07/2019 2124   CREATININE 0.99 04/07/2016 0832      Component Value Date/Time   CALCIUM 9.6 03/07/2019 2124   ALKPHOS 77 03/07/2019 2124   AST 18 03/07/2019 2124   ALT 13 03/07/2019 2124   BILITOT 1.3 (H) 03/07/2019 2124   BILITOT 0.6 08/04/2017 1641       Assessment and Plan:    59 year old man with:  1.  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  His initial imaging study of the pelvis did show questionable sclerotic lesions as well as pelvic adenopathy.  The natural course of this disease was reviewed today as well as treatment options.  He understands he has an incurable malignancy with disease that only can be palliated at this time.  Treatment options at this time includes androgen deprivation therapy which is the gold standard and the backbone to treating such condition.  Therapy escalation with Colin Warner or Taxotere chemotherapy or so may be a  consideration at this time.  He is considering the option between chemical versus surgical castration.  I discussed with him the risks and benefits of both approaches.  The complication associated with androgen deprivation in general were reviewed including hot flashes, weight gain, sexual dysfunction regardless of the modality.  He will consider that option and will inform Dr. Louis Meckel in the near future about his decision.  Upon starting androgen deprivation, we will consider therapy escalation after that especially after completing his staging.  2.  Androgen deprivation therapy: He is considering a chemical versus surgical castration.  3.  Bone directed therapy: This will be discussed pending the results of his bone scan staging.  4.  Pain: Appears to be arthritic in nature but certainly could be related to metastatic disease to the bone.  He has Ultram and Advil at this time.  5.  Follow-up: Will be in the next 4 to 6 weeks to follow his progress and consider therapy escalation afterwards.   60  minutes was spent spent on this encounter.  Time was dedicated to reviewing his disease status, treatment options complication lytic therapy and future plan of care.   Thank you for the referral. A copy of this consult has been forwarded to the requesting physician.

## 2019-05-07 ENCOUNTER — Telehealth: Payer: Self-pay | Admitting: Medical Oncology

## 2019-05-07 NOTE — Telephone Encounter (Signed)
Left message to introduce myself as the prostate nurse navigator and requested a return call. I was unable to meet him 2/5, when he consulted with Dr. Alen Blew.

## 2019-05-10 ENCOUNTER — Encounter (HOSPITAL_COMMUNITY)
Admission: RE | Admit: 2019-05-10 | Discharge: 2019-05-10 | Disposition: A | Discharge status: Home / Self Care | Payer: Medicaid Other | Source: Ambulatory Visit | Attending: Urology | Admitting: Urology

## 2019-05-10 ENCOUNTER — Other Ambulatory Visit: Payer: Self-pay

## 2019-05-10 ENCOUNTER — Ambulatory Visit (HOSPITAL_COMMUNITY)
Admission: RE | Admit: 2019-05-10 | Discharge: 2019-05-10 | Disposition: A | Discharge status: Home / Self Care | Payer: Medicaid Other | Source: Ambulatory Visit | Attending: Urology | Admitting: Urology

## 2019-05-10 DIAGNOSIS — C61 Malignant neoplasm of prostate: Secondary | ICD-10-CM | POA: Diagnosis not present

## 2019-05-10 MED ORDER — TECHNETIUM TC 99M MEDRONATE IV KIT
20.0000 | PACK | Freq: Once | INTRAVENOUS | Status: AC
  Administered 2019-05-10: 09:00:00 22 via INTRAVENOUS

## 2019-05-24 ENCOUNTER — Other Ambulatory Visit: Payer: Self-pay | Admitting: Urology

## 2019-05-28 ENCOUNTER — Encounter (HOSPITAL_BASED_OUTPATIENT_CLINIC_OR_DEPARTMENT_OTHER): Payer: Self-pay | Admitting: Urology

## 2019-05-29 ENCOUNTER — Encounter (HOSPITAL_BASED_OUTPATIENT_CLINIC_OR_DEPARTMENT_OTHER): Payer: Self-pay | Admitting: Urology

## 2019-05-31 ENCOUNTER — Other Ambulatory Visit: Payer: Self-pay

## 2019-05-31 ENCOUNTER — Encounter (HOSPITAL_BASED_OUTPATIENT_CLINIC_OR_DEPARTMENT_OTHER): Payer: Self-pay | Admitting: Urology

## 2019-05-31 NOTE — Progress Notes (Signed)
Spoke w/ via phone for pre-op interview--- PT Lab needs dos---- Istat 8 and EKG              Lab results------ no COVID test ------ 06-01-2019 @ 1225 Arrive at ------- 0745 NPO after ------ MN Medications to take morning of surgery ----- Prilosec w/ sips of water Diabetic medication ----- n/a Patient Special Instructions ----- asked pt to bring home medications in original prescription bottle's.  Reviewed [facility] guidelines and visitor restriction policy. Pre-Op special Istructions ----- n/a Patient verbalized understanding of instructions that were given at this phone interview. Patient denies shortness of breath, chest pain, fever, cough a this phone interview.

## 2019-06-01 ENCOUNTER — Other Ambulatory Visit (HOSPITAL_COMMUNITY)
Admission: RE | Admit: 2019-06-01 | Discharge: 2019-06-01 | Disposition: A | Discharge status: Home / Self Care | Payer: Medicaid Other | Source: Ambulatory Visit | Attending: Urology | Admitting: Urology

## 2019-06-01 DIAGNOSIS — Z01812 Encounter for preprocedural laboratory examination: Secondary | ICD-10-CM | POA: Insufficient documentation

## 2019-06-01 DIAGNOSIS — Z20822 Contact with and (suspected) exposure to covid-19: Secondary | ICD-10-CM | POA: Insufficient documentation

## 2019-06-01 LAB — SARS CORONAVIRUS 2 (TAT 6-24 HRS): SARS Coronavirus 2: NEGATIVE

## 2019-06-05 ENCOUNTER — Encounter (HOSPITAL_BASED_OUTPATIENT_CLINIC_OR_DEPARTMENT_OTHER)
Admission: RE | Disposition: A | Discharge status: Home / Self Care | Payer: Self-pay | Source: Home / Self Care | Attending: Urology

## 2019-06-05 ENCOUNTER — Ambulatory Visit (HOSPITAL_BASED_OUTPATIENT_CLINIC_OR_DEPARTMENT_OTHER): Payer: Medicaid Other | Admitting: Anesthesiology

## 2019-06-05 ENCOUNTER — Ambulatory Visit (HOSPITAL_BASED_OUTPATIENT_CLINIC_OR_DEPARTMENT_OTHER)
Admission: RE | Admit: 2019-06-05 | Discharge: 2019-06-05 | Disposition: A | Discharge status: Home / Self Care | Payer: Medicaid Other | Attending: Urology | Admitting: Urology

## 2019-06-05 ENCOUNTER — Other Ambulatory Visit: Payer: Self-pay

## 2019-06-05 ENCOUNTER — Encounter (HOSPITAL_BASED_OUTPATIENT_CLINIC_OR_DEPARTMENT_OTHER): Payer: Self-pay | Admitting: Urology

## 2019-06-05 DIAGNOSIS — F17210 Nicotine dependence, cigarettes, uncomplicated: Secondary | ICD-10-CM | POA: Diagnosis not present

## 2019-06-05 DIAGNOSIS — C61 Malignant neoplasm of prostate: Secondary | ICD-10-CM | POA: Insufficient documentation

## 2019-06-05 DIAGNOSIS — Z8249 Family history of ischemic heart disease and other diseases of the circulatory system: Secondary | ICD-10-CM | POA: Diagnosis not present

## 2019-06-05 DIAGNOSIS — N448 Other noninflammatory disorders of the testis: Secondary | ICD-10-CM | POA: Insufficient documentation

## 2019-06-05 DIAGNOSIS — Z79899 Other long term (current) drug therapy: Secondary | ICD-10-CM | POA: Diagnosis not present

## 2019-06-05 HISTORY — DX: Hyperlipidemia, unspecified: E78.5

## 2019-06-05 HISTORY — DX: Gastro-esophageal reflux disease without esophagitis: K21.9

## 2019-06-05 HISTORY — DX: Other chronic pain: G89.29

## 2019-06-05 HISTORY — DX: Alcohol dependence, uncomplicated: F10.20

## 2019-06-05 HISTORY — DX: Personal history of other diseases of the digestive system: Z87.19

## 2019-06-05 HISTORY — DX: Malignant neoplasm of prostate: C61

## 2019-06-05 HISTORY — PX: ORCHIECTOMY: SHX2116

## 2019-06-05 HISTORY — DX: Nocturia: R35.1

## 2019-06-05 LAB — POCT I-STAT, CHEM 8
BUN: 11 mg/dL (ref 6–20)
Calcium, Ion: 1.22 mmol/L (ref 1.15–1.40)
Chloride: 100 mmol/L (ref 98–111)
Creatinine, Ser: 0.9 mg/dL (ref 0.61–1.24)
Glucose, Bld: 95 mg/dL (ref 70–99)
HCT: 43 % (ref 39.0–52.0)
Hemoglobin: 14.6 g/dL (ref 13.0–17.0)
Potassium: 4.1 mmol/L (ref 3.5–5.1)
Sodium: 139 mmol/L (ref 135–145)
TCO2: 30 mmol/L (ref 22–32)

## 2019-06-05 SURGERY — ORCHIECTOMY
Anesthesia: General | Site: Scrotum | Laterality: Bilateral

## 2019-06-05 MED ORDER — MIDAZOLAM HCL 2 MG/2ML IJ SOLN
INTRAMUSCULAR | Status: AC
  Filled 2019-06-05: qty 2

## 2019-06-05 MED ORDER — BUPIVACAINE HCL 0.25 % IJ SOLN
INTRAMUSCULAR | Status: DC | PRN
  Administered 2019-06-05: 18 mL

## 2019-06-05 MED ORDER — CEFAZOLIN SODIUM-DEXTROSE 2-4 GM/100ML-% IV SOLN
2.0000 g | INTRAVENOUS | Status: AC
  Administered 2019-06-05: 2 g via INTRAVENOUS
  Filled 2019-06-05: qty 100

## 2019-06-05 MED ORDER — FENTANYL CITRATE (PF) 100 MCG/2ML IJ SOLN
25.0000 ug | INTRAMUSCULAR | Status: DC | PRN
  Administered 2019-06-05: 50 ug via INTRAVENOUS
  Filled 2019-06-05: qty 1

## 2019-06-05 MED ORDER — FENTANYL CITRATE (PF) 100 MCG/2ML IJ SOLN
INTRAMUSCULAR | Status: AC
  Filled 2019-06-05: qty 2

## 2019-06-05 MED ORDER — ONDANSETRON HCL 4 MG/2ML IJ SOLN
INTRAMUSCULAR | Status: DC | PRN
  Administered 2019-06-05: 4 mg via INTRAVENOUS

## 2019-06-05 MED ORDER — DEXAMETHASONE SODIUM PHOSPHATE 4 MG/ML IJ SOLN
INTRAMUSCULAR | Status: DC | PRN
  Administered 2019-06-05: 10 mg via INTRAVENOUS

## 2019-06-05 MED ORDER — OXYCODONE-ACETAMINOPHEN 5-325 MG PO TABS: 1.0000 | ORAL_TABLET | ORAL | 0 refills | Status: DC | PRN

## 2019-06-05 MED ORDER — KETOROLAC TROMETHAMINE 30 MG/ML IJ SOLN
INTRAMUSCULAR | Status: AC
  Filled 2019-06-05: qty 1

## 2019-06-05 MED ORDER — ONDANSETRON HCL 4 MG/2ML IJ SOLN
INTRAMUSCULAR | Status: AC
  Filled 2019-06-05: qty 2

## 2019-06-05 MED ORDER — DEXAMETHASONE SODIUM PHOSPHATE 10 MG/ML IJ SOLN
INTRAMUSCULAR | Status: AC
  Filled 2019-06-05: qty 1

## 2019-06-05 MED ORDER — LIDOCAINE 2% (20 MG/ML) 5 ML SYRINGE
INTRAMUSCULAR | Status: AC
  Filled 2019-06-05: qty 5

## 2019-06-05 MED ORDER — MIDAZOLAM HCL 5 MG/5ML IJ SOLN
INTRAMUSCULAR | Status: DC | PRN
  Administered 2019-06-05: 2 mg via INTRAVENOUS

## 2019-06-05 MED ORDER — KETOROLAC TROMETHAMINE 30 MG/ML IJ SOLN
INTRAMUSCULAR | Status: DC | PRN
  Administered 2019-06-05: 15 mg via INTRAVENOUS

## 2019-06-05 MED ORDER — PROPOFOL 10 MG/ML IV BOLUS
INTRAVENOUS | Status: DC | PRN
  Administered 2019-06-05: 200 mg via INTRAVENOUS

## 2019-06-05 MED ORDER — ACETAMINOPHEN 500 MG PO TABS
ORAL_TABLET | ORAL | Status: AC
  Filled 2019-06-05: qty 2

## 2019-06-05 MED ORDER — FENTANYL CITRATE (PF) 100 MCG/2ML IJ SOLN
INTRAMUSCULAR | Status: DC | PRN
  Administered 2019-06-05: 50 ug via INTRAVENOUS
  Administered 2019-06-05 (×6): 25 ug via INTRAVENOUS

## 2019-06-05 MED ORDER — LIDOCAINE HCL (CARDIAC) PF 100 MG/5ML IV SOSY
PREFILLED_SYRINGE | INTRAVENOUS | Status: DC | PRN
  Administered 2019-06-05: 80 mg via INTRAVENOUS

## 2019-06-05 MED ORDER — CEFAZOLIN SODIUM-DEXTROSE 2-4 GM/100ML-% IV SOLN
INTRAVENOUS | Status: AC
  Filled 2019-06-05: qty 100

## 2019-06-05 MED ORDER — ACETAMINOPHEN 500 MG PO TABS
1000.0000 mg | ORAL_TABLET | Freq: Once | ORAL | Status: AC
  Administered 2019-06-05: 1000 mg via ORAL
  Filled 2019-06-05: qty 2

## 2019-06-05 MED ORDER — PROPOFOL 10 MG/ML IV BOLUS
INTRAVENOUS | Status: AC
  Filled 2019-06-05: qty 40

## 2019-06-05 MED ORDER — LACTATED RINGERS IV SOLN
INTRAVENOUS | Status: DC
  Filled 2019-06-05 (×3): qty 1000

## 2019-06-05 SURGICAL SUPPLY — 54 items
BNDG GAUZE ELAST 4 BULKY (GAUZE/BANDAGES/DRESSINGS) ×3 IMPLANT
COVER BACK TABLE 60X90IN (DRAPES) ×3 IMPLANT
COVER MAYO STAND STRL (DRAPES) ×3 IMPLANT
COVER WAND RF STERILE (DRAPES) ×4 IMPLANT
DERMABOND ADVANCED (GAUZE/BANDAGES/DRESSINGS) ×2
DERMABOND ADVANCED .7 DNX12 (GAUZE/BANDAGES/DRESSINGS) ×1 IMPLANT
DISSECTOR ROUND CHERRY 3/8 STR (MISCELLANEOUS) ×1 IMPLANT
DRAIN PENROSE 0.25X18 (DRAIN) ×3 IMPLANT
DRAPE LAPAROTOMY 100X72 PEDS (DRAPES) ×3 IMPLANT
ELECT NDL BLADE 2-5/6 (NEEDLE) ×1 IMPLANT
ELECT NDL TIP 2.8 STRL (NEEDLE) IMPLANT
ELECT NEEDLE BLADE 2-5/6 (NEEDLE) ×3 IMPLANT
ELECT NEEDLE TIP 2.8 STRL (NEEDLE) IMPLANT
ELECT REM PT RETURN 9FT ADLT (ELECTROSURGICAL) ×3
ELECTRODE REM PT RTRN 9FT ADLT (ELECTROSURGICAL) ×1 IMPLANT
GLOVE BIO SURGEON STRL SZ 6 (GLOVE) ×2 IMPLANT
GLOVE BIO SURGEON STRL SZ7.5 (GLOVE) ×4 IMPLANT
GLOVE BIOGEL PI IND STRL 6.5 (GLOVE) IMPLANT
GLOVE BIOGEL PI IND STRL 7.5 (GLOVE) IMPLANT
GLOVE BIOGEL PI INDICATOR 6.5 (GLOVE) ×2
GLOVE BIOGEL PI INDICATOR 7.5 (GLOVE) ×2
GOWN STRL REUS W/TWL LRG LVL3 (GOWN DISPOSABLE) ×7 IMPLANT
KIT TURNOVER CYSTO (KITS) ×3 IMPLANT
NEEDLE HYPO 22GX1.5 SAFETY (NEEDLE) ×3 IMPLANT
NS IRRIG 500ML POUR BTL (IV SOLUTION) ×3 IMPLANT
PACK BASIN DAY SURGERY FS (CUSTOM PROCEDURE TRAY) ×3 IMPLANT
PENCIL BUTTON HOLSTER BLD 10FT (ELECTRODE) ×3 IMPLANT
SET COLLECT BLD 25X3/4 12 (NEEDLE) IMPLANT
SPONGE LAP 4X18 RFD (DISPOSABLE) IMPLANT
SUPPORT SCROTAL LG STRP (MISCELLANEOUS) IMPLANT
SUPPORTER ATHLETIC LG (MISCELLANEOUS)
SUT MNCRL AB 4-0 PS2 18 (SUTURE) ×3 IMPLANT
SUT PROLENE 2 0 CT2 30 (SUTURE) ×3 IMPLANT
SUT PROLENE 2 0 SH DA (SUTURE) ×3 IMPLANT
SUT SILK 0 TIES 10X30 (SUTURE) ×2 IMPLANT
SUT SILK 2 0 SH (SUTURE) IMPLANT
SUT SILK 3 0 (SUTURE) ×3
SUT SILK 3-0 18XBRD TIE 12 (SUTURE) ×1 IMPLANT
SUT VIC AB 2-0 SH 27 (SUTURE) ×6
SUT VIC AB 2-0 SH 27XBRD (SUTURE) IMPLANT
SUT VIC AB 3-0 SH 27 (SUTURE) ×6
SUT VIC AB 3-0 SH 27X BRD (SUTURE) ×2 IMPLANT
SUT VICRYL 2 0 18  UND BR (SUTURE)
SUT VICRYL 2 0 18 UND BR (SUTURE) IMPLANT
SYR 10ML LL (SYRINGE) IMPLANT
SYR 20ML LL LF (SYRINGE) ×3 IMPLANT
SYR BULB IRRIGATION 50ML (SYRINGE) ×3 IMPLANT
SYR CONTROL 10ML LL (SYRINGE) ×3 IMPLANT
TOWEL OR 17X26 10 PK STRL BLUE (TOWEL DISPOSABLE) ×3 IMPLANT
TRAY DSU PREP LF (CUSTOM PROCEDURE TRAY) ×3 IMPLANT
TUBE CONNECTING 12'X1/4 (SUCTIONS)
TUBE CONNECTING 12X1/4 (SUCTIONS) IMPLANT
WATER STERILE IRR 500ML POUR (IV SOLUTION) IMPLANT
YANKAUER SUCT BULB TIP NO VENT (SUCTIONS) IMPLANT

## 2019-06-05 NOTE — OR Nursing (Signed)
Call to Dr. Louis Meckel to put in post op orders. Darin Engels rn

## 2019-06-05 NOTE — Anesthesia Postprocedure Evaluation (Signed)
Anesthesia Post Note  Patient: Colin Warner  Procedure(s) Performed: ORCHIECTOMY (Bilateral Scrotum)     Patient location during evaluation: PACU Anesthesia Type: General Level of consciousness: awake and alert Pain management: pain level controlled Vital Signs Assessment: post-procedure vital signs reviewed and stable Respiratory status: spontaneous breathing, nonlabored ventilation, respiratory function stable and patient connected to nasal cannula oxygen Cardiovascular status: blood pressure returned to baseline and stable Postop Assessment: no apparent nausea or vomiting Anesthetic complications: no    Last Vitals:  Vitals:   06/05/19 1130 06/05/19 1143  BP: (!) 158/91   Pulse: 62 (!) 59  Resp: 14 14  Temp:    SpO2: 100% 100%    Last Pain:  Vitals:   06/05/19 0755  TempSrc: Oral  PainSc: 9                  Amilya Haver L Cachet Mccutchen

## 2019-06-05 NOTE — Transfer of Care (Signed)
Immediate Anesthesia Transfer of Care Note  Patient: Colin Warner  Procedure(s) Performed: Procedure(s) (LRB): ORCHIECTOMY (Bilateral)  Patient Location: PACU  Anesthesia Type: General  Level of Consciousness: awake, sedated, patient cooperative and responds to stimulation  Airway & Oxygen Therapy: Patient Spontanous Breathing and Patient connected to Schaller O2 and soft FM   Post-op Assessment: Report given to PACU RN, Post -op Vital signs reviewed and stable and Patient moving all extremities  Post vital signs: Reviewed and stable  Complications: No apparent anesthesia complications

## 2019-06-05 NOTE — Op Note (Addendum)
Preoperative Diagnosis: Prostate cancer  Postoperative Diagnosis:  Same  Procedure(s) Performed:   1. Bilateral simple orchiectomy  Attending Surgeon:  Burman Nieves, MD  Resident Surgeon:  Kerrie Pleasure, MD  Assistant(s): none  Anesthesia:  General via LMA  Fluids:  See anesthesia record  Estimated blood loss:  Minimal  Specimens:   Right testis Left testis  Cultures:  None  Drains:  None  Complications:  None  Indications: 59 y.o. male with history of metastatic prostate cancer who presents for surgical castration to slow progression of prostate cancer. He elected to undergo bilateral simple orchiectomy. Risks & benefits of the procedure discussed with the patient, who wishes to proceed.  Findings:   Grossly normal testes bilaterally. Sent for pathology.   Description:  The patient was correctly identified in the preop holding area where written informed consent as well potential risk and complication reviewed. He agreed. They were brought back to the operative suite where a preinduction timeout was performed. Once correct information was verified, general anesthesia was induced via LMA. They were placed in supine position. They were prepped and draped in the usual sterile fashion and given appropriate preoperative antibiotics. A second timeout was then performed.  Local anesthetic was applied along the incision line.  We began by making a 4 cm midline incision along the median raphae of the scrotum.  Dartos was opened, and electrocautery and blunt dissection was used to free the right testis and spermatic cord from their surrounding attachments.  The spermatic cord was divided into 2 packets, and each was clamped.  The spermatic cord was divided sharply distal to the clamps.  The testis and spermatic cord were passed off to sent to pathology. We then utilized 2-0 Vicryl suture ligature and a proximal 0 silk free tie on each of the proximal spermatic cord packets. We ensured  excellent hemostasis with Bovie electrocautery.  We then proceeded to utilize electrocautery and blunt dissection to free the left testis and spermatic cord from their surrounding attachments. The spermatic cord was divided into 2 packets, and each was clamped.  The spermatic cord was divided sharply distal to the clamps.  The testis and spermatic cord were passed off to sent to pathology. We then utilized 2-0 Vicryl suture ligature and a proximal 0 silk free tie on each of the proximal spermatic cord packets. We ensured excellent hemostasis on the left side with Bovie electrocautery.  Dartos was reapproximated with a 3-0 Vicryl suture, and the septum was included in this closure layer.  A 4-0 Monocryl running mattress suture was utilized to close the skin.  Dermabond was applied over this. Scrotal fluffs and support were placed and the patient was awoken from anesthesia and taken to the recovery area in stable condition.  All sponge and needle counts were correct x2.    Post Op Plan:   Discharge to home once meets PACU criteria. Follow-up with urology and oncology as scheduled.

## 2019-06-05 NOTE — Discharge Instructions (Signed)

## 2019-06-05 NOTE — Anesthesia Preprocedure Evaluation (Addendum)
Anesthesia Evaluation  Patient identified by MRN, date of birth, ID band Patient awake    Reviewed: Allergy & Precautions, NPO status , Patient's Chart, lab work & pertinent test results  Airway Mallampati: II  TM Distance: >3 FB Neck ROM: Full    Dental  (+) Poor Dentition, Missing, Dental Advisory Given,    Pulmonary neg pulmonary ROS, Current Smoker,    Pulmonary exam normal breath sounds clear to auscultation       Cardiovascular hypertension, Pt. on medications negative cardio ROS Normal cardiovascular exam Rhythm:Regular Rate:Normal     Neuro/Psych negative neurological ROS  negative psych ROS   GI/Hepatic GERD  ,(+)     substance abuse  alcohol use,   Endo/Other  negative endocrine ROS  Renal/GU negative Renal ROS  negative genitourinary   Musculoskeletal negative musculoskeletal ROS (+)   Abdominal   Peds  Hematology negative hematology ROS (+)   Anesthesia Other Findings   Reproductive/Obstetrics                            Anesthesia Physical Anesthesia Plan  ASA: III  Anesthesia Plan: General   Post-op Pain Management:    Induction: Intravenous  PONV Risk Score and Plan: 1 and Ondansetron, Dexamethasone and Midazolam  Airway Management Planned: LMA  Additional Equipment:   Intra-op Plan:   Post-operative Plan: Extubation in OR  Informed Consent: I have reviewed the patients History and Physical, chart, labs and discussed the procedure including the risks, benefits and alternatives for the proposed anesthesia with the patient or authorized representative who has indicated his/her understanding and acceptance.     Dental advisory given  Plan Discussed with: CRNA  Anesthesia Plan Comments:         Anesthesia Quick Evaluation

## 2019-06-05 NOTE — Interval H&P Note (Signed)
History and Physical Interval Note:  06/05/2019 8:57 AM  Colin Warner  has presented today for surgery, with the diagnosis of metastatic prostate cancer.  The various methods of treatment have been discussed with the patient and family. After consideration of risks, benefits and other options for treatment, the patient has consented to  Procedure(s): ORCHIECTOMY (Bilateral) as a surgical intervention.  The patient's history has been reviewed, patient examined, no change in status, stable for surgery.  I have reviewed the patient's chart and labs.  Questions were answered to the patient's satisfaction.     Ardis Hughs

## 2019-06-05 NOTE — H&P (Signed)
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 CT scan in the hospital showed a 5 cm perianal abscess but also showed a heterogeneously enhancing prostate gland with mass-effect into the bladder and bladder wall trabeculation. There were left inguinal nodes measuring up to 1 cm which were thought to be reactive. No urine sample or PSA was sent is no PSA previously noted. Presents today for what appears to be follow-up of his imaging results (although he could no give a clear reason why he was here). He has had no urologic symptoms since admission. He does not believe he has ever had his PSA checked. Endorses frequent urination when drinking alcohol. Also has nocturia with alcohol use   Of note the patient is a severe drinker, drinking 2 to 4 40 ounce beers per day. He is also a current smoker, half a pack a day for 30 years. DRE 40cc gland, no nodules   04/25/19:  PSA returned as >500, concerning for metastatic disease. Biopsy performed which was very uncomfortable for the patient. Right apical nodule on exam.   05/02/19  Prostate Bx Pathology: High grade prostate cancer in 12/12 cores. Ranging from Gleason 4+3 to 5+4. He recovered well from his biopsy and denies hematuria, blood in his stool, pain, or fevers.   Today we discussed likely metastatic disease and need for workup with Bone scan + labs. Would hold off on CT given last imaging on 03/08/19. Discussed need for ADT and options including medical and surgical castration. Needs help with new cancer diagnosis and ongoing therapy so referred to [facility] (Dr. Alen Blew). I explained to the patient several times that he had cancer but his health literacy is low and it is unclear if he fully understands the impact of his diagnosis. His wife is  currently in the hospital as well and he was focused on picking her up.   05/23/2019  Today we reviewed the bone scan, likely metastatic prostate cancer, and plan for surgery (bilateral orhiectomy). He had discussed this via phone with Dr Anthony Sar as well and understand plan. He reports no new symptoms since last visit, and would like to proceed with surgery.   Prior labs reviewed from this month. UA negative today.   He also complains of low back pain and lower abdominal pain. Abdominal pain occurs after he takes a medicine for his stomach, but he is not sure which medicine it is. He is not sure why he is taking medicine for his stomach. Denies constipation, diarrhea, nausea, emesis.       ALLERGIES: None    MEDICATIONS: Omeprazole 20 mg capsule,delayed release  Simvastatin 20 mg tablet  Ibuprofen  Lisinopril-Hydrochlorothiazide 20 mg-25 mg tablet  Naproxen  Tylenol Pm Extra Strength     GU PSH: Prostate Needle Biopsy - 04/25/2019       PSH Notes: Abcess removed from buttocks.    NON-GU PSH: Surgical Pathology, Gross And Microscopic Examination For Prostate Needle - 04/25/2019     GU PMH: Prostate Cancer - 05/02/2019 Elevated PSA - 04/25/2019 BPH w/LUTS - 04/04/2019      PMH Notes: Hx of stomach ulcers   NON-GU PMH: Arthritis Hypertension    FAMILY HISTORY: Death - Mother, Father Hypertension - Mother Kidney Failure - Mother   SOCIAL HISTORY: Marital  Status: Married Preferred Language: English; Ethnicity: Not Hispanic Or Latino; Race: Black or African American Current Smoking Status: Patient smokes. Smokes 1/2 pack per day.   Tobacco Use Assessment Completed: Used Tobacco in last 30 days? Excessive Drinker.  Drinks 4+ caffeinated drinks per day. 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    REVIEW OF SYSTEMS:    GU Review Male:   Patient denies frequent urination, hard to postpone urination, burning/ pain with urination, get up at night to urinate, leakage of  urine, stream starts and stops, trouble starting your stream, have to strain to urinate , erection problems, and penile pain.  Gastrointestinal (Upper):   Patient denies nausea, vomiting, and indigestion/ heartburn.  Gastrointestinal (Lower):   Patient denies diarrhea and constipation.  Constitutional:   Patient denies fever, night sweats, weight loss, and fatigue.  Skin:   Patient denies skin rash/ lesion and itching.  Eyes:   Patient denies blurred vision and double vision.  Ears/ Nose/ Throat:   Patient denies sore throat and sinus problems.  Hematologic/Lymphatic:   Patient denies swollen glands and easy bruising.  Cardiovascular:   Patient denies leg swelling and chest pains.  Respiratory:   Patient denies cough and shortness of breath.  Endocrine:   Patient denies excessive thirst.  Musculoskeletal:   Patient reports back pain. Patient denies joint pain.  Neurological:   Patient denies dizziness and headaches.  Psychologic:   Patient denies depression and anxiety.   Notes: Right hip and back pain wants to talk about some pain medication    VITAL SIGNS:      05/23/2019 02:34 PM  Weight 134 lb / 60.78 kg  Height 65 in / 165.1 cm  Heart Rate 86 /min  Temperature 99.1 F / 37.2 C  BMI 22.3 kg/m   GU PHYSICAL EXAMINATION:      Notes: Normal uncircumcised male phallus.  Testes descended bilaterally.    MULTI-SYSTEM PHYSICAL EXAMINATION:    Constitutional: Well-nourished. No physical deformities. Normally developed. Good grooming.  Neck: Neck symmetrical, not swollen. Normal tracheal position.  Respiratory: No labored breathing, no use of accessory muscles.   Cardiovascular: Normal temperature, normal extremity pulses, no swelling, no varicosities.  Lymphatic: No enlargement of neck, axillae, groin.  Skin: No paleness, no jaundice, no cyanosis. No lesion, no ulcer, no rash.  Neurologic / Psychiatric: Oriented to time, oriented to place, oriented to person. No depression, no  anxiety, no agitation.  Gastrointestinal: No mass, no tenderness, no rigidity, non obese abdomen.  Eyes: Normal conjunctivae. Normal eyelids.  Ears, Nose, Mouth, and Throat: Left ear no scars, no lesions, no masses. Right ear no scars, no lesions, no masses. Nose no scars, no lesions, no masses. Normal hearing. Normal lips.  Musculoskeletal: Normal gait and station of head and neck.   Notes: Mild tenderness over right iliac crest.  Moderate low back tenderness.       PAST DATA REVIEWED:  Source Of History:  Patient   04/05/19  PSA  Total PSA 556.00 ng/mL    05/03/19  Hormones  Testosterone, Total 384.3 ng/dL    PROCEDURES:          Urinalysis Dipstick Dipstick Cont'd  Color: Yellow Bilirubin: Neg mg/dL  Appearance: Clear Ketones: Neg mg/dL  Specific Gravity: 1.025 Blood: Neg ery/uL  pH: 6.0 Protein: Trace mg/dL  Glucose: Neg mg/dL Urobilinogen: 0.2 mg/dL    Nitrites: Neg    Leukocyte Esterase: Neg leu/uL    ASSESSMENT:      ICD-10 Details  1  GU:   Prostate Cancer - C61    PLAN:            Medications New Meds: Oxycodone Hcl 5 mg tablet 1 tablet PO Q 6 H PRN   #10  0 Refill(s)            Document Letter(s):  Created for Patient: Clinical Summary         Notes:   59 yo M with likely metastatic prostate cancer presenting to discuss bone scan results and plan for bilateral orchiectomy. He reports no changes since last visit with Dr Anthony Sar, and he understands diagnosis and plan.   He does have low back pain and lower abdominal pain as well as mild right hip tenderness. While bone pain can be a result of metastatic prostate cancer, his primary complaint seems to be his abdominal discomfort, which I do not believe is related. Therefore, we discussed utilizing OTC tylenol as needed. I also prescribed him a short course of narcotic pain medication until he can follow up with his PCP regarding optimizing his pain regimen and evaluating his abdominal pain; he will call to  make an appointment with her next week. I let him know that we will not plan to continue managing his pain medication as he needs to have this followed by his PCP in the future.   He was in agreement with above plan.   Plan:  Proceed with bilateral orchiectomy; request sent by Dr Anthony Sar.   Short prescription for pain medication sent; advised patient will need follow up with PCP to discuss lower abdominal pain and need for further pain medication as it is unclear to me that it is related to his prostate cancer.

## 2019-06-05 NOTE — Anesthesia Procedure Notes (Signed)
Procedure Name: LMA Insertion Date/Time: 06/05/2019 9:20 AM Performed by: Justice Rocher, CRNA Pre-anesthesia Checklist: Patient identified, Emergency Drugs available, Suction available and Patient being monitored Patient Re-evaluated:Patient Re-evaluated prior to induction Oxygen Delivery Method: Circle system utilized Preoxygenation: Pre-oxygenation with 100% oxygen Induction Type: IV induction Ventilation: Mask ventilation without difficulty LMA: LMA inserted LMA Size: 4.0 Number of attempts: 1 Airway Equipment and Method: Bite block Placement Confirmation: positive ETCO2,  CO2 detector and breath sounds checked- equal and bilateral Tube secured with: Tape Dental Injury: Teeth and Oropharynx as per pre-operative assessment  Comments: Teeth poor condition - LMA placed without difficulty , soft gauze wrapped around tongue blade - placed by Dr Lanetta Inch MDA, ventilates well

## 2019-06-06 LAB — SURGICAL PATHOLOGY

## 2019-06-11 ENCOUNTER — Other Ambulatory Visit: Payer: Self-pay

## 2019-06-11 ENCOUNTER — Encounter: Payer: Self-pay | Admitting: Family Medicine

## 2019-06-11 ENCOUNTER — Ambulatory Visit: Payer: Medicaid Other | Attending: Family Medicine | Admitting: Family Medicine

## 2019-06-11 VITALS — BP 102/68 | HR 133 | Ht 65.0 in | Wt 135.4 lb

## 2019-06-11 DIAGNOSIS — Z9079 Acquired absence of other genital organ(s): Secondary | ICD-10-CM | POA: Diagnosis not present

## 2019-06-11 DIAGNOSIS — C61 Malignant neoplasm of prostate: Secondary | ICD-10-CM | POA: Insufficient documentation

## 2019-06-11 DIAGNOSIS — I10 Essential (primary) hypertension: Secondary | ICD-10-CM

## 2019-06-11 DIAGNOSIS — E7800 Pure hypercholesterolemia, unspecified: Secondary | ICD-10-CM | POA: Diagnosis not present

## 2019-06-11 DIAGNOSIS — G8929 Other chronic pain: Secondary | ICD-10-CM

## 2019-06-11 DIAGNOSIS — M25561 Pain in right knee: Secondary | ICD-10-CM

## 2019-06-11 DIAGNOSIS — C799 Secondary malignant neoplasm of unspecified site: Secondary | ICD-10-CM

## 2019-06-11 DIAGNOSIS — M25562 Pain in left knee: Secondary | ICD-10-CM

## 2019-06-11 MED ORDER — ACETAMINOPHEN-CODEINE #3 300-30 MG PO TABS: 1.0000 | ORAL_TABLET | Freq: Two times a day (BID) | ORAL | 0 refills | Status: DC | PRN

## 2019-06-11 MED ORDER — NAPROXEN 500 MG PO TABS: 500.0000 mg | ORAL_TABLET | Freq: Two times a day (BID) | ORAL | 2 refills | Status: DC

## 2019-06-11 MED ORDER — POLYETHYLENE GLYCOL 3350 17 GM/SCOOP PO POWD: 17.0000 g | Freq: Two times a day (BID) | ORAL | 1 refills | Status: DC | PRN

## 2019-06-11 MED ORDER — SIMVASTATIN 20 MG PO TABS: 20.0000 mg | ORAL_TABLET | Freq: Every day | ORAL | 1 refills | Status: DC

## 2019-06-11 MED ORDER — LISINOPRIL-HYDROCHLOROTHIAZIDE 20-25 MG PO TABS: 1.0000 | ORAL_TABLET | Freq: Every day | ORAL | 1 refills | Status: DC

## 2019-06-11 MED FILL — NAPROXEN 500 MG TABLET: 500 | 15 days supply | Qty: 30 | Fill #0

## 2019-06-11 MED FILL — SIMVASTATIN 20 MG TABLET: 20 | 30 days supply | Qty: 30 | Fill #0

## 2019-06-11 MED FILL — LISINOPRIL-HCTZ 20-25 MG TA: 20-25 | 30 days supply | Qty: 30 | Fill #0

## 2019-06-11 MED FILL — ACETAMINOPHEN/COD #3 TABLET: 300-30 | 15 days supply | Qty: 30 | Fill #0

## 2019-06-11 MED FILL — POLYETHYLENE GLYCOL 3350 PO: 17 | 7 days supply | Qty: 238 | Fill #0

## 2019-06-11 NOTE — Progress Notes (Signed)
Subjective:  Patient ID: Colin Warner, male    DOB: 08-07-1960  Age: 59 y.o. MRN: [id number]  CC: Hypertension   HPI Colin Warner is a 59 year old male with a history of hypertension, hyperlipidemia, cancer of the prostate (Gleason score 5+4 = 9) with possible metastasis to the pelvis status post recent orchiectomy.  He underwent orchiectomy on 06/05/19 Scrotal swelling has improved, last BM was 4 days ago; Last dose of Percocet he took today and is currently in pain. Yet to see Urology for f/u which comes up in 06/27/19. He has his wife at home to help him with dressing changes. Last visit with oncology was on 05/02/2018 and as per notes he has an incurable malignancy with a disease that can only be palliated.  Sees Oncology again on 07/01/18 .  Compliant with his antihypertensive and his statin.  Past Medical History:  Diagnosis Date  . Alcohol dependence ([place])   . Chronic pain   . Chronic pain    knees, back, and hips  . GERD (gastroesophageal reflux disease)   . History of blood transfusion 11/2015   2 units  . History of gastritis    EGD 12-14-2015 for hematemesis;   erosive duodenopathy  . History of GI bleed    09/ 2017  upper gi bleed (hematemesis)  per EGD erosive duodenopathy,  recieved 2units PRBCs  . History of rectal abscess    12/ 2020  s/p I&D perirectal abscess  . Hyperlipidemia   . Hypertension    followed by pcp   (05-31-2019  per pt never had a stress test)  . Metastasis from malignant neoplasm of prostate [facility]) urologist-- dr herrick/  oncologist--- dr Alen Blew   dx 04-25-2019,  Gleason 5+4,  PSA 556  . Nocturia     Past Surgical History:  Procedure Laterality Date  . CYSTOSCOPY  teen   per pt for hematuria  . ESOPHAGOGASTRODUODENOSCOPY N/A 12/14/2015   Procedure: ESOPHAGOGASTRODUODENOSCOPY (EGD);  Surgeon: Wonda Horner, MD;  Location: [facility] ENDOSCOPY;  Service: Endoscopy;  Laterality: N/A;  . INCISION AND DRAINAGE PERIRECTAL ABSCESS N/A 03/08/2019   Procedure: IRRIGATION AND DEBRIDEMENT PERINEAL ABSCESS EXAM UNDER ANESTHESIA;  Surgeon: Ileana Roup, MD;  Location: [place];  Service: General;  Laterality: N/A;  . ORCHIECTOMY Bilateral 06/05/2019   Procedure: ORCHIECTOMY;  Surgeon: Ardis Hughs, MD;  Location: [facility];  Service: Urology;  Laterality: Bilateral;    Family History  Problem Relation Age of Onset  . Hypertension Mother   . Chronic Renal Failure Mother   . Alcoholism Father     No Known Allergies  Outpatient Medications Prior to Visit  Medication Sig Dispense Refill  . lisinopril-hydrochlorothiazide (ZESTORETIC) 20-25 MG tablet Take 1 tablet by mouth daily. (Patient taking differently: Take 1 tablet by mouth daily. ) 90 tablet 1  . omeprazole (PRILOSEC) 20 MG capsule Take 1 capsule (20 mg total) by mouth daily. (Patient taking differently: Take 20 mg by mouth daily. ) 30 capsule 6  . oxyCODONE-acetaminophen (PERCOCET/ROXICET) 5-325 MG tablet Take 1 tablet by mouth every 4 (four) hours as needed for severe pain. 15 tablet 0  . simvastatin (ZOCOR) 20 MG tablet Take 1 tablet (20 mg total) by mouth at bedtime. (Patient taking differently: Take 20 mg by mouth at bedtime. ) 90 tablet 1  . acetaminophen (TYLENOL) 500 MG tablet Take 1 tablet (500 mg total) by mouth every 6 (six) hours as needed. (Patient not taking: Reported on 06/11/2019)    .  naproxen (NAPROSYN) 500 MG tablet Take 1 tablet (500 mg total) by mouth 2 (two) times daily with a meal. (Patient not taking: Reported on 05/31/2019) 30 tablet 2   No facility-administered medications prior to visit.     ROS Review of Systems  Constitutional: Negative for activity change and appetite change.  HENT: Negative for sinus pressure and sore throat.   Eyes: Negative for visual disturbance.  Respiratory: Negative for cough, chest tightness and shortness of breath.   Cardiovascular: Negative for chest pain and leg swelling.  Gastrointestinal:  Negative for abdominal distention, abdominal pain, constipation and diarrhea.  Endocrine: Negative.   Genitourinary: Negative for dysuria.  Musculoskeletal: Negative for joint swelling and myalgias.  Skin: Negative for rash.  Allergic/Immunologic: Negative.   Neurological: Negative for weakness, light-headedness and numbness.  Psychiatric/Behavioral: Negative for dysphoric mood and suicidal ideas.    Objective:  BP 102/68   Pulse (!) 133   Ht 5\' 5"  (1.651 m)   Wt 135 lb 6.4 oz (61.4 kg)   SpO2 99%   BMI 22.53 kg/m   BP/Weight 06/11/2019 0000000 [id number]  Systolic BP [id number]  Diastolic BP 68 95 [id number]  Wt. (Lbs) 135.4 137 135.8  BMI 22.53 22.8 22.6      Physical Exam Exam conducted with a chaperone present.  Constitutional:      Appearance: He is well-developed.  Neck:     Vascular: No JVD.  Cardiovascular:     Rate and Rhythm: Tachycardia present.     Heart sounds: Normal heart sounds. No murmur.  Pulmonary:     Effort: Pulmonary effort is normal.     Breath sounds: Normal breath sounds. No wheezing or rales.  Chest:     Chest wall: No tenderness.  Abdominal:     General: Bowel sounds are normal. There is no distension.     Palpations: Abdomen is soft. There is no mass.     Tenderness: There is no abdominal tenderness.  Genitourinary:    Comments: Edematous scrotum with surgical incision on inferior aspect, bloody discharge, no evidence of infection. Musculoskeletal:        General: Normal range of motion.     Right lower leg: No edema.     Left lower leg: No edema.  Neurological:     Mental Status: He is alert and oriented to person, place, and time.  Psychiatric:        Mood and Affect: Mood normal.     CMP Latest Ref Rng & Units 06/05/2019 03/07/2019 08/30/2018  Glucose 70 - 99 mg/dL 95 121(H) 93  BUN 6 - 20 mg/dL 11 7 6   Creatinine 0.61 - 1.24 mg/dL 0.90 0.88 0.85  Sodium 135 - 145 mmol/L 139 134(L) 135  Potassium 3.5 - 5.1 mmol/L 4.1 4.3 3.7    Chloride 98 - 111 mmol/L 100 98 101  CO2 22 - 32 mmol/L - 24 25  Calcium 8.9 - 10.3 mg/dL - 9.6 9.2  Total Protein 6.5 - 8.1 g/dL - 8.1 -  Total Bilirubin 0.3 - 1.2 mg/dL - 1.3(H) -  Alkaline Phos 38 - 126 U/L - 77 -  AST 15 - 41 U/L - 18 -  ALT 0 - 44 U/L - 13 -    Lipid Panel     Component Value Date/Time   CHOL 154 04/07/2016 0832   TRIG 66 04/07/2016 0832   HDL 77 04/07/2016 0832   CHOLHDL 2.0 04/07/2016 0832    CBC    Component  Value Date/Time   WBC 15.5 (H) 03/07/2019 2124   RBC 4.88 03/07/2019 2124   HGB 14.6 06/05/2019 0757   HCT 43.0 06/05/2019 0757   PLT 636 (H) 03/07/2019 2124   MCV 89.3 03/07/2019 2124   MCH 30.1 03/07/2019 2124   MCHC 33.7 03/07/2019 2124   RDW 12.9 03/07/2019 2124   LYMPHSABS 2.1 03/07/2019 2124   MONOABS 1.7 (H) 03/07/2019 2124   EOSABS 0.0 03/07/2019 2124   BASOSABS 0.0 03/07/2019 2124    No results found for: HGBA1C  Assessment & Plan:   1. Pure hypercholesterolemia Controlled He is due for lipid panel We will order once acute condition has resolved low-cholesterol diet - simvastatin (ZOCOR) 20 MG tablet; Take 1 tablet (20 mg total) by mouth at bedtime.  Dispense: 90 tablet; Refill: 1  2. Essential hypertension Controlled He is tachycardic today likely from his pain - lisinopril-hydrochlorothiazide (ZESTORETIC) 20-25 MG tablet; Take 1 tablet by mouth daily.  Dispense: 90 tablet; Refill: 1  3. Chronic pain of both knees Stable - naproxen (NAPROSYN) 500 MG tablet; Take 1 tablet (500 mg total) by mouth 2 (two) times daily with a meal.  Dispense: 30 tablet; Refill: 2  4. 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 S/P orchiectomy Appointment with surgeon does not come up until 2 weeks I will have him follow-up with Dr. Hulen Skains here in the clinic next week - acetaminophen-codeine (TYLENOL #3) 300-30 MG tablet; Take 1  tablet by mouth every 12 (twelve) hours as needed for moderate pain.  Dispense: 30 tablet; Refill: 0     Charlott Rakes, MD, FAAFP. [facility] and [place], [place]   06/11/2019, 2:58 PM

## 2019-06-12 ENCOUNTER — Encounter: Payer: Self-pay | Admitting: Family Medicine

## 2019-06-20 ENCOUNTER — Other Ambulatory Visit: Payer: Self-pay

## 2019-06-20 ENCOUNTER — Encounter: Payer: Self-pay | Admitting: General Surgery

## 2019-06-20 ENCOUNTER — Ambulatory Visit: Payer: Medicaid Other | Attending: General Surgery | Admitting: General Surgery

## 2019-06-20 DIAGNOSIS — S3022XA Contusion of scrotum and testes, initial encounter: Secondary | ICD-10-CM

## 2019-06-20 DIAGNOSIS — F1020 Alcohol dependence, uncomplicated: Secondary | ICD-10-CM | POA: Diagnosis not present

## 2019-06-20 DIAGNOSIS — N492 Inflammatory disorders of scrotum: Secondary | ICD-10-CM | POA: Diagnosis not present

## 2019-06-20 NOTE — Progress Notes (Signed)
F /u orchiectomy

## 2019-06-20 NOTE — Patient Instructions (Signed)
Keep scrotum clean and dry.  May clean with soap and water, rinse well, pat dry and dover with antibiotic ointment on incision and cover with 4x4, ABD, and garments.  Keep appointment iwht Urologist for April 1.

## 2019-06-20 NOTE — Progress Notes (Signed)
Established Patient Office Visit  Subjective:  Patient ID: Colin Warner, male    DOB: April 16, 1960  Age: 59 y.o. MRN: [id number]  CC: No chief complaint on file.   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  No fevers, no chills, no nausea or vomiting.  He states that the wound has decreased in size and feel more comfortable.  He is able to void without difficulty, and is having normal bowel movements.  Past Medical History:  Diagnosis Date  . Alcohol dependence ([place])   . Chronic pain   . Chronic pain    knees, back, and hips  . GERD (gastroesophageal reflux disease)   . History of blood transfusion 11/2015   2 units  . History of gastritis    EGD 12-14-2015 for hematemesis;   erosive duodenopathy  . History of GI bleed    09/ 2017  upper gi bleed (hematemesis)  per EGD erosive duodenopathy,  recieved 2units PRBCs  . History of rectal abscess    12/ 2020  s/p I&D perirectal abscess  . Hyperlipidemia   . Hypertension    followed by pcp   (05-31-2019  per pt never had a stress test)  . 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 Nocturia     Past Surgical History:  Procedure Laterality Date  . CYSTOSCOPY  [age]   per pt for hematuria  . ESOPHAGOGASTRODUODENOSCOPY N/A 12/14/2015   Procedure: ESOPHAGOGASTRODUODENOSCOPY (EGD);  Surgeon: Wonda Horner, MD;  Location: [facility] ENDOSCOPY;  Service: Endoscopy;  Laterality: N/A;  . INCISION AND DRAINAGE PERIRECTAL ABSCESS N/A 03/08/2019   Procedure: IRRIGATION AND DEBRIDEMENT PERINEAL ABSCESS EXAM UNDER ANESTHESIA;  Surgeon: Ileana Roup, MD;  Location: [place];  Service: General;  Laterality: N/A;  . ORCHIECTOMY Bilateral 06/05/2019   Procedure: ORCHIECTOMY;  Surgeon: Ardis Hughs, MD;  Location:  [facility];  Service: Urology;  Laterality: Bilateral;    Family History  Problem Relation Age of Onset  . Hypertension Mother   . Chronic Renal Failure Mother   . Alcoholism Father     Social History   Socioeconomic History  . Marital status: Married    Spouse name: Not on file  . Number of children: Not on file  . Years of education: Not on file  . Highest education level: Not on file  Occupational History  . Not on file  Tobacco Use  . Smoking status: Current Every Day Smoker    Packs/day: 0.25    Years: 20.00    Pack years: 5.00    Types: Cigarettes  . Smokeless tobacco: Never Used  . Tobacco comment: 05-31-2019  per pt 5-6 cig daily  Substance and Sexual Activity  . Alcohol use: Yes    Comment: 2 -- 40oz beer daily  . Drug use: Not Currently    Comment: 05-31-2019   per pt quit age 59s  . Sexual activity: Not on file  Other Topics Concern  . Not on file  Social History Narrative  . Not on file   Social Determinants of Health   Financial Resource Strain:   . Difficulty of Paying Living Expenses:   Food Insecurity:   . Worried About Charity fundraiser in the Last Year:   . Ran  Out of Food in the Last Year:   Transportation Needs:   . Lack of Transportation (Medical):   Marland Kitchen Lack of Transportation (Non-Medical):   Physical Activity:   . Days of Exercise per Week:   . Minutes of Exercise per Session:   Stress:   . Feeling of Stress :   Social Connections:   . Frequency of Communication with Friends and Family:   . Frequency of Social Gatherings with Friends and Family:   . Attends Religious Services:   . Active Member of Clubs or Organizations:   . Attends Archivist Meetings:   Marland Kitchen Marital Status:   Intimate Partner Violence:   . Fear of Current or Ex-Partner:   . Emotionally Abused:   Marland Kitchen Physically Abused:   . Sexually Abused:     Outpatient Medications Prior to Visit  Medication Sig Dispense Refill  . acetaminophen-codeine  (TYLENOL #3) 300-30 MG tablet Take 1 tablet by mouth every 12 (twelve) hours as needed for moderate pain. 30 tablet 0  . lisinopril-hydrochlorothiazide (ZESTORETIC) 20-25 MG tablet Take 1 tablet by mouth daily. 90 tablet 1  . naproxen (NAPROSYN) 500 MG tablet Take 1 tablet (500 mg total) by mouth 2 (two) times daily with a meal. 30 tablet 2  . omeprazole (PRILOSEC) 20 MG capsule Take 1 capsule (20 mg total) by mouth daily. (Patient taking differently: Take 20 mg by mouth daily. ) 30 capsule 6  . oxyCODONE-acetaminophen (PERCOCET/ROXICET) 5-325 MG tablet Take 1 tablet by mouth every 4 (four) hours as needed for severe pain. 15 tablet 0  . polyethylene glycol powder (GLYCOLAX/MIRALAX) 17 GM/SCOOP powder Take 17 g by mouth 2 (two) times daily as needed. 3350 g 1  . simvastatin (ZOCOR) 20 MG tablet Take 1 tablet (20 mg total) by mouth at bedtime. 90 tablet 1  . acetaminophen (TYLENOL) 500 MG tablet Take 1 tablet (500 mg total) by mouth every 6 (six) hours as needed. (Patient not taking: Reported on 06/11/2019)     No facility-administered medications prior to visit.    No Known Allergies  ROS Review of Systems  Constitutional: Positive for fatigue. Negative for chills and fever.  Genitourinary: Positive for scrotal swelling. Negative for difficulty urinating, dysuria and testicular pain.      Objective:    Physical Exam  Genitourinary:    Right testis shows swelling (Surgical absence of testicles, large blood clot in the scrotal sac with opening in the upper part of the wound.  No evidence of infection). Left testis shows swelling (Surgical absence of testicles, large blood clot in the scrotal sac with opening in the upper part of the wound.  No evidence of infection).  Vitals reviewed.    Abot 500cc of clotted blood expressed fro the opening in the upper part of the surgical wound.  No probing or irrigation.  Significant decompression.  Wounds cleaned with sterile saline, the antibiotic  ointment, sterile 4x4, ABD pad, then his absorbant underwear.  He has a folllow up appointment with the Urologist on June 27, 2019.  BP (!) 144/90 (BP Location: Left Arm, Patient Position: Sitting)   Pulse (!) 107   Temp 98 F (36.7 C)   Resp 16   Ht 5\' 5"  (1.651 m)   Wt 134 lb (60.8 kg)   SpO2 100%   BMI 22.30 kg/m  Wt Readings from Last 3 Encounters:  06/20/19 134 lb (60.8 kg)  06/11/19 135 lb 6.4 oz (61.4 kg)  06/05/19 137 lb (62.1 kg)  Health Maintenance Due  Topic Date Due  . Hepatitis C Screening  Never done  . TETANUS/TDAP  Never done  . COLONOSCOPY  Never done  . INFLUENZA VACCINE  Never done    There are no preventive care reminders to display for this patient.  No results found for: TSH Lab Results  Component Value Date   WBC 15.5 (H) 03/07/2019   HGB 14.6 06/05/2019   HCT 43.0 06/05/2019   MCV 89.3 03/07/2019   PLT 636 (H) 03/07/2019   Lab Results  Component Value Date   NA 139 06/05/2019   K 4.1 06/05/2019   CO2 24 03/07/2019   GLUCOSE 95 06/05/2019   BUN 11 06/05/2019   CREATININE 0.90 06/05/2019   BILITOT 1.3 (H) 03/07/2019   ALKPHOS 77 03/07/2019   AST 18 03/07/2019   ALT 13 03/07/2019   PROT 8.1 03/07/2019   ALBUMIN 3.6 03/07/2019   CALCIUM 9.6 03/07/2019   ANIONGAP 12 03/07/2019   Lab Results  Component Value Date   CHOL 154 04/07/2016   Lab Results  Component Value Date   HDL 77 04/07/2016   No results found for: [facility] Lab Results  Component Value Date   TRIG 66 04/07/2016   Lab Results  Component Value Date   CHOLHDL 2.0 04/07/2016   No results found for: HGBA1C    Assessment & Plan:   Problem List Items Addressed This Visit    Alcohol use disorder, moderate, dependence ([place])   Scrotal hematoma      No orders of the defined types were placed in this encounter.   Follow-up: Return if symptoms worsen or fail to improve, for Keep appoointment with urologist.    Judeth Horn, MD

## 2019-07-03 ENCOUNTER — Other Ambulatory Visit: Payer: Self-pay

## 2019-07-03 ENCOUNTER — Inpatient Hospital Stay: Payer: Medicaid Other | Attending: Oncology | Admitting: Oncology

## 2019-07-03 ENCOUNTER — Other Ambulatory Visit: Payer: Self-pay | Admitting: Family Medicine

## 2019-07-03 ENCOUNTER — Inpatient Hospital Stay: Payer: Medicaid Other

## 2019-07-03 VITALS — BP 155/93 | HR 78 | Temp 98.7°F | Resp 18 | Ht 65.0 in | Wt 143.5 lb

## 2019-07-03 DIAGNOSIS — C7951 Secondary malignant neoplasm of bone: Secondary | ICD-10-CM | POA: Diagnosis not present

## 2019-07-03 DIAGNOSIS — Z79899 Other long term (current) drug therapy: Secondary | ICD-10-CM | POA: Diagnosis not present

## 2019-07-03 DIAGNOSIS — C61 Malignant neoplasm of prostate: Secondary | ICD-10-CM | POA: Diagnosis present

## 2019-07-03 DIAGNOSIS — Z9079 Acquired absence of other genital organ(s): Secondary | ICD-10-CM

## 2019-07-03 DIAGNOSIS — R599 Enlarged lymph nodes, unspecified: Secondary | ICD-10-CM | POA: Diagnosis not present

## 2019-07-03 LAB — CBC WITH DIFFERENTIAL (CANCER CENTER ONLY)
Abs Immature Granulocytes: 0.03 10*3/uL (ref 0.00–0.07)
Basophils Absolute: 0 10*3/uL (ref 0.0–0.1)
Basophils Relative: 1 %
Eosinophils Absolute: 0.1 10*3/uL (ref 0.0–0.5)
Eosinophils Relative: 1 %
HCT: 35.3 % — ABNORMAL LOW (ref 39.0–52.0)
Hemoglobin: 11.2 g/dL — ABNORMAL LOW (ref 13.0–17.0)
Immature Granulocytes: 1 %
Lymphocytes Relative: 37 %
Lymphs Abs: 2.3 10*3/uL (ref 0.7–4.0)
MCH: 27.5 pg (ref 26.0–34.0)
MCHC: 31.7 g/dL (ref 30.0–36.0)
MCV: 86.5 fL (ref 80.0–100.0)
Monocytes Absolute: 0.8 10*3/uL (ref 0.1–1.0)
Monocytes Relative: 13 %
Neutro Abs: 3 10*3/uL (ref 1.7–7.7)
Neutrophils Relative %: 47 %
Platelet Count: 662 10*3/uL — ABNORMAL HIGH (ref 150–400)
RBC: 4.08 MIL/uL — ABNORMAL LOW (ref 4.22–5.81)
RDW: 14.8 % (ref 11.5–15.5)
WBC Count: 6.3 10*3/uL (ref 4.0–10.5)
nRBC: 0 % (ref 0.0–0.2)

## 2019-07-03 LAB — CMP (CANCER CENTER ONLY)
ALT: 30 U/L (ref 0–44)
AST: 45 U/L — ABNORMAL HIGH (ref 15–41)
Albumin: 3.7 g/dL (ref 3.5–5.0)
Alkaline Phosphatase: 94 U/L (ref 38–126)
Anion gap: 11 (ref 5–15)
BUN: 14 mg/dL (ref 6–20)
CO2: 28 mmol/L (ref 22–32)
Calcium: 9.4 mg/dL (ref 8.9–10.3)
Chloride: 101 mmol/L (ref 98–111)
Creatinine: 1.12 mg/dL (ref 0.61–1.24)
GFR, Est AFR Am: >60 mL/min (ref >60)
GFR, Estimated: >60 mL/min (ref >60)
Glucose, Bld: 102 mg/dL — ABNORMAL HIGH (ref 70–99)
Potassium: 4.2 mmol/L (ref 3.5–5.1)
Sodium: 140 mmol/L (ref 135–145)
Total Bilirubin: 0.3 mg/dL (ref 0.3–1.2)
Total Protein: 8.1 g/dL (ref 6.5–8.1)

## 2019-07-03 MED FILL — NAPROXEN 500 MG TABLET: 500 | 15 days supply | Qty: 30 | Fill #1

## 2019-07-03 MED FILL — SIMVASTATIN 20 MG TABLET: 20 | 30 days supply | Qty: 30 | Fill #1

## 2019-07-03 MED FILL — OMEPRAZOLE 20 MG CAP: 20 | 30 days supply | Qty: 30 | Fill #4

## 2019-07-03 MED FILL — LISINOPRIL-HCTZ 20-25 MG TA: 20-25 | 30 days supply | Qty: 30 | Fill #1

## 2019-07-03 NOTE — Progress Notes (Signed)
Hematology and Oncology Follow Up Visit  Mayan Fillers [id number] 1960-04-04 59 y.o. 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  He was found to have Gleason score 4+4 = 9, PSA 556 with documented metastatic disease to the bone.   Prior Therapy:  He is status post bilateral orchiectomy completed on June 05, 2019.  Current therapy:  Under evaluation for additional therapy.  Interim History: Mr. Flitter returns today for a follow-up visit.  He underwent bilateral orchiectomy with no major complications.  He still has some drainage and swelling around his testicle and groin.  He denies any hematochezia or hematuria.  He has reported improvement in his weight and is pain.  He is ambulating without any major difficulties.     Medications: I have reviewed the patient's current medications.  Current Outpatient Medications  Medication Sig Dispense Refill  . acetaminophen (TYLENOL) 500 MG tablet Take 1 tablet (500 mg total) by mouth every 6 (six) hours as needed. (Patient not taking: Reported on 06/11/2019)    . acetaminophen-codeine (TYLENOL #3) 300-30 MG tablet Take 1 tablet by mouth every 12 (twelve) hours as needed for moderate pain. 30 tablet 0  . lisinopril-hydrochlorothiazide (ZESTORETIC) 20-25 MG tablet Take 1 tablet by mouth daily. 90 tablet 1  . naproxen (NAPROSYN) 500 MG tablet Take 1 tablet (500 mg total) by mouth 2 (two) times daily with a meal. 30 tablet 2  . omeprazole (PRILOSEC) 20 MG capsule Take 1 capsule (20 mg total) by mouth daily. (Patient taking differently: Take 20 mg by mouth daily. ) 30 capsule 6  . oxyCODONE-acetaminophen (PERCOCET/ROXICET) 5-325 MG tablet Take 1 tablet by mouth every 4 (four) hours as needed for severe pain. 15 tablet 0  . polyethylene glycol powder (GLYCOLAX/MIRALAX) 17 GM/SCOOP powder Take 17 g by mouth 2 (two) times daily as  needed. 3350 g 1  . simvastatin (ZOCOR) 20 MG tablet Take 1 tablet (20 mg total) by mouth at bedtime. 90 tablet 1   No current facility-administered medications for this visit.     Allergies: No Known Allergies    Physical Exam: Blood pressure (!) 155/93, pulse 78, temperature 98.7 F (37.1 C), temperature source Temporal, resp. rate 18, height 5\' 5"  (1.651 m), weight 143 lb 8 oz (65.1 kg), SpO2 100 %.   ECOG: 1   General appearance: Comfortable appearing without any discomfort Head: Normocephalic without any trauma Oropharynx: Mucous membranes are moist and pink without any thrush or ulcers. Eyes: Pupils are equal and round reactive to light. Lymph nodes: No cervical, supraclavicular, inguinal or axillary lymphadenopathy.   Heart:regular rate and rhythm.  S1 and S2 without leg edema. Lung: Clear without any rhonchi or wheezes.  No dullness to percussion. Abdomin: Soft, nontender, nondistended with good bowel sounds.  No hepatosplenomegaly. Musculoskeletal: No joint deformity or effusion.  Full range of motion noted. Neurological: No deficits noted on motor, sensory and deep tendon reflex exam. Skin: No petechial rash or dryness.  Appeared moist.     Lab Results: Lab Results  Component Value Date   WBC 15.5 (H) 03/07/2019   HGB 14.6 06/05/2019   HCT 43.0 06/05/2019   MCV 89.3 03/07/2019   PLT 636 (H) 03/07/2019     Chemistry      Component Value Date/Time   NA 139 06/05/2019 0757   NA 140 08/04/2017 1641   K 4.1 06/05/2019 0757   CL 100 06/05/2019 0757  CO2 24 03/07/2019 2124   BUN 11 06/05/2019 0757   BUN 13 08/04/2017 1641   CREATININE 0.90 06/05/2019 0757   CREATININE 0.99 04/07/2016 0832      Component Value Date/Time   CALCIUM 9.6 03/07/2019 2124   ALKPHOS 77 03/07/2019 2124   AST 18 03/07/2019 2124   ALT 13 03/07/2019 2124   BILITOT 1.3 (H) 03/07/2019 2124   BILITOT 0.6 08/04/2017 1641     IMPRESSION: Osseous metastases involving BILATERAL ribs,  LEFT T12, and RIGHT iliac bone.  Additional scattered degenerative type uptake as above with nonspecific uptake at the RIGHT lateral aspect of the upper cervical spine.     Impression and Plan:    59 year old man with:  1.    Advanced prostate cancer with disease to the bone diagnosed in January 2021.  He was found to have castration-sensitive disease with adenopathy and bone involvement.   Treatment options moving forward were reviewed at this time.  Risks and benefits of therapy escalation utilizing Zytiga, Xtandi or systemic chemotherapy were discussed.  Complication associated with these treatments were reviewed at this time.  After discussion today, we have opted to defer the start of this therapy till he is fully healed from his recent surgery based on his preference.   2.  Androgen deprivation therapy: He is status post surgical castration in March 2021.  3.  Bone directed therapy: Bone scan obtained on February 12 showed osseous metastasis involving bilateral ribs T12 and right iliac bone.  Bone directed therapy would be reasonable and I recommended starting with calcium and vitamin D supplement.  4.  Pain: Predominantly post surgical in nature in his testicle.  Pain is manageable with Ultram.  5.  Follow-up: Will be in the next 2 months for follow-up and consideration to start additional therapy.  30  minutes were dedicated to this visit. The time was spent on reviewing laboratory data, imaging studies, discussing treatment options, and answering questions regarding future plan.   Zola Button, MD 4/7/20218:44 AM

## 2019-07-04 ENCOUNTER — Telehealth: Payer: Self-pay

## 2019-07-04 ENCOUNTER — Telehealth: Payer: Self-pay | Admitting: Oncology

## 2019-07-04 LAB — PROSTATE-SPECIFIC AG, SERUM (LABCORP): Prostate Specific Ag, Serum: 54.2 ng/mL — ABNORMAL HIGH (ref 0.0–4.0)

## 2019-07-04 NOTE — Telephone Encounter (Signed)
-----   Message from Wyatt Portela, MD sent at 07/04/2019  8:14 AM EDT ----- Please let him know his PSA is down.

## 2019-07-04 NOTE — Telephone Encounter (Signed)
Called patient and made him aware of his PSA result. Patient verbalized understanding.  

## 2019-07-04 NOTE — Telephone Encounter (Signed)
Scheduled appt per 4/7 los.  Spoke with pt and and he is aware of the appt date and time.

## 2019-07-05 MED FILL — ACETAMINOPHEN/COD #3 TABLET: 300-30 | 15 days supply | Qty: 30 | Fill #0

## 2019-07-19 ENCOUNTER — Other Ambulatory Visit (HOSPITAL_COMMUNITY): Payer: Self-pay | Admitting: Student in an Organized Health Care Education/Training Program

## 2019-07-19 ENCOUNTER — Other Ambulatory Visit: Payer: Self-pay | Admitting: Urology

## 2019-07-19 ENCOUNTER — Other Ambulatory Visit: Payer: Self-pay | Admitting: Student in an Organized Health Care Education/Training Program

## 2019-07-19 ENCOUNTER — Other Ambulatory Visit (HOSPITAL_COMMUNITY): Payer: Self-pay | Admitting: Urology

## 2019-07-19 DIAGNOSIS — N5082 Scrotal pain: Secondary | ICD-10-CM

## 2019-07-24 ENCOUNTER — Ambulatory Visit (HOSPITAL_COMMUNITY): Payer: Self-pay

## 2019-07-24 ENCOUNTER — Encounter (HOSPITAL_COMMUNITY): Payer: Self-pay

## 2019-09-03 ENCOUNTER — Telehealth: Payer: Self-pay | Admitting: Pharmacist

## 2019-09-03 ENCOUNTER — Inpatient Hospital Stay: Payer: Medicaid Other

## 2019-09-03 ENCOUNTER — Inpatient Hospital Stay: Payer: Medicaid Other | Attending: Oncology | Admitting: Oncology

## 2019-09-03 ENCOUNTER — Other Ambulatory Visit: Payer: Self-pay

## 2019-09-03 VITALS — BP 170/102 | HR 83 | Temp 98.1°F | Resp 18 | Wt 154.2 lb

## 2019-09-03 DIAGNOSIS — R52 Pain, unspecified: Secondary | ICD-10-CM | POA: Insufficient documentation

## 2019-09-03 DIAGNOSIS — C61 Malignant neoplasm of prostate: Secondary | ICD-10-CM | POA: Diagnosis present

## 2019-09-03 DIAGNOSIS — Z9079 Acquired absence of other genital organ(s): Secondary | ICD-10-CM | POA: Diagnosis not present

## 2019-09-03 DIAGNOSIS — Z79899 Other long term (current) drug therapy: Secondary | ICD-10-CM | POA: Diagnosis not present

## 2019-09-03 DIAGNOSIS — I10 Essential (primary) hypertension: Secondary | ICD-10-CM | POA: Diagnosis not present

## 2019-09-03 LAB — CBC WITH DIFFERENTIAL (CANCER CENTER ONLY)
Abs Immature Granulocytes: 0.01 10*3/uL (ref 0.00–0.07)
Basophils Absolute: 0 10*3/uL (ref 0.0–0.1)
Basophils Relative: 0 %
Eosinophils Absolute: 0 10*3/uL (ref 0.0–0.5)
Eosinophils Relative: 1 %
HCT: 43.5 % (ref 39.0–52.0)
Hemoglobin: 14.2 g/dL (ref 13.0–17.0)
Immature Granulocytes: 0 %
Lymphocytes Relative: 40 %
Lymphs Abs: 1.9 10*3/uL (ref 0.7–4.0)
MCH: 27 pg (ref 26.0–34.0)
MCHC: 32.6 g/dL (ref 30.0–36.0)
MCV: 82.7 fL (ref 80.0–100.0)
Monocytes Absolute: 0.4 10*3/uL (ref 0.1–1.0)
Monocytes Relative: 8 %
Neutro Abs: 2.4 10*3/uL (ref 1.7–7.7)
Neutrophils Relative %: 51 %
Platelet Count: 564 10*3/uL — ABNORMAL HIGH (ref 150–400)
RBC: 5.26 MIL/uL (ref 4.22–5.81)
RDW: 15 % (ref 11.5–15.5)
WBC Count: 4.8 10*3/uL (ref 4.0–10.5)
nRBC: 0 % (ref 0.0–0.2)

## 2019-09-03 LAB — CMP (CANCER CENTER ONLY)
ALT: 48 U/L — ABNORMAL HIGH (ref 0–44)
AST: 43 U/L — ABNORMAL HIGH (ref 15–41)
Albumin: 4 g/dL (ref 3.5–5.0)
Alkaline Phosphatase: 97 U/L (ref 38–126)
Anion gap: 12 (ref 5–15)
BUN: 14 mg/dL (ref 6–20)
CO2: 26 mmol/L (ref 22–32)
Calcium: 9.8 mg/dL (ref 8.9–10.3)
Chloride: 101 mmol/L (ref 98–111)
Creatinine: 1.29 mg/dL — ABNORMAL HIGH (ref 0.61–1.24)
GFR, Est AFR Am: >60 mL/min (ref >60)
GFR, Estimated: >60 mL/min (ref >60)
Glucose, Bld: 105 mg/dL — ABNORMAL HIGH (ref 70–99)
Potassium: 4.1 mmol/L (ref 3.5–5.1)
Sodium: 139 mmol/L (ref 135–145)
Total Bilirubin: 0.6 mg/dL (ref 0.3–1.2)
Total Protein: 8.2 g/dL — ABNORMAL HIGH (ref 6.5–8.1)

## 2019-09-03 MED ORDER — ABIRATERONE ACETATE 250 MG PO TABS: 1000.0000 mg | ORAL_TABLET | Freq: Every day | ORAL | 0 refills | Status: DC

## 2019-09-03 NOTE — Telephone Encounter (Signed)
Oral Oncology Pharmacist Encounter  Received new prescription for Zytiga (abiraterone) for the treatment of advance prostate cancer castration-sensitive, planned duration until disease progression or unacceptable drug toxicity. Patient is s/p bilateral orchiectomy.  CMP from 09/03/19 assessed, no relevant lab abnormalities. Prescription dose and frequency assessed.   Current medication list in Epic reviewed, no relevant DDIs with Zytiga identified.  Patient does not have prescription insurance and will need to apply for manufacturer assistance for the Zytiga.  Darl Pikes, PharmD, BCPS, BCOP, CPP Hematology/Oncology Clinical Pharmacist Practitioner [facility]/HP/AP [facility] [phone]  09/03/2019 1:13 PM

## 2019-09-03 NOTE — Progress Notes (Signed)
Hematology and Oncology Follow Up Visit  Colin Warner [id number] 1960-07-20 59 y.o. 09/03/2019 12:29 PM Colin Warner, MDNewlin, Colin Ferretti, MD   Principle Diagnosis: 59 year old man with advanced prostate cancer with disease to the bone presented in January 2021.  He was found to have Gleason score 4+4 = 9, PSA 556 and castration-sensitive disease.   Prior Therapy:  He is status post bilateral orchiectomy completed on June 05, 2019.  Current therapy:  Additional therapy is under consideration.  Interim History: Colin Warner is here for return evaluation.  Since the last visit, he reports no major changes in his health.  He denies any nausea, vomiting or abdominal pain.  He denies any bone pain or.  His performance status and quality of life remained reasonable.  He still has some pain related to his orchiectomy surgery but is recovered.  He is currently working with light duties.     Medications: Updated on review. Current Outpatient Medications  Medication Sig Dispense Refill  . acetaminophen-codeine (TYLENOL #3) 300-30 MG tablet TAKE 1 TABLET BY MOUTH EVERY 12 (TWELVE) HOURS AS NEEDED FOR MODERATE PAIN. 30 tablet 0  . acetaminophen (TYLENOL) 500 MG tablet Take 1 tablet (500 mg total) by mouth every 6 (six) hours as needed. (Patient not taking: Reported on 06/11/2019)    . lisinopril-hydrochlorothiazide (ZESTORETIC) 20-25 MG tablet Take 1 tablet by mouth daily. 90 tablet 1  . naproxen (NAPROSYN) 500 MG tablet Take 1 tablet (500 mg total) by mouth 2 (two) times daily with a meal. 30 tablet 2  . omeprazole (PRILOSEC) 20 MG capsule Take 1 capsule (20 mg total) by mouth daily. (Patient taking differently: Take 20 mg by mouth daily. ) 30 capsule 6  . oxyCODONE-acetaminophen (PERCOCET/ROXICET) 5-325 MG tablet Take 1 tablet by mouth every 4 (four) hours as needed for severe pain. 15 tablet 0  . polyethylene glycol powder (GLYCOLAX/MIRALAX) 17 GM/SCOOP powder Take 17 g by mouth 2 (two) times  daily as needed. 3350 g 1  . simvastatin (ZOCOR) 20 MG tablet Take 1 tablet (20 mg total) by mouth at bedtime. 90 tablet 1   No current facility-administered medications for this visit.     Allergies: No Known Allergies    Physical Exam: Blood pressure (!) 170/102, pulse 83, temperature 98.1 F (36.7 C), temperature source Temporal, resp. rate 18, weight 154 lb 3.2 oz (69.9 kg), SpO2 99 %.    ECOG: 1    General appearance: Alert, awake without any distress. Head: Atraumatic without abnormalities Oropharynx: Without any thrush or ulcers. Eyes: No scleral icterus. Lymph nodes: No lymphadenopathy noted in the cervical, supraclavicular, or axillary nodes Heart:regular rate and rhythm, without any murmurs or gallops.   Lung: Clear to auscultation without any rhonchi, wheezes or dullness to percussion. Abdomin: Soft, nontender without any shifting dullness or ascites. Musculoskeletal: No clubbing or cyanosis. Neurological: No motor or sensory deficits. Skin: No rashes or lesions.     Lab Results: Lab Results  Component Value Date   WBC 6.3 07/03/2019   HGB 11.2 (L) 07/03/2019   HCT 35.3 (L) 07/03/2019   MCV 86.5 07/03/2019   PLT 662 (H) 07/03/2019     Chemistry      Component Value Date/Time   NA 140 07/03/2019 0832   NA 140 08/04/2017 1641   K 4.2 07/03/2019 0832   CL 101 07/03/2019 0832   CO2 28 07/03/2019 0832   BUN 14 07/03/2019 0832   BUN 13 08/04/2017 1641   CREATININE 1.12 07/03/2019 3557  CREATININE 0.99 04/07/2016 0832      Component Value Date/Time   CALCIUM 9.4 07/03/2019 0832   ALKPHOS 94 07/03/2019 0832   AST 45 (H) 07/03/2019 0832   ALT 30 07/03/2019 0832   BILITOT 0.3 07/03/2019 0832         Impression and Plan:    58 year old man with:  1.    Castration-sensitive prostate cancer with bone metastasis diagnosed and January 2021.    The natural course of this disease was reviewed again and therapy escalation options were discussed.   It would include Zytiga, Xtandi or systemic chemotherapy.  Risks and benefits of all these options were reviewed.  Potential complications were discussed specifically associated with Zytiga including nausea, fatigue and edema.  After discussion today, he is agreeable to proceed.  He has financial issue and insurance coverage issues and we will assist him in obtaining medication accordingly.   2.  Androgen deprivation therapy: No additional androgen deprivation is needed at this time.  He status post orchiectomy.  3.  Bone directed therapy: I recommended calcium and vitamin D supplements and start Xgeva after obtaining dental clearance.  4.  Pain: Related to surgery and appears to be improving.  5.  Hypertension: His blood pressure is elevated today but has been under control between visits.  We will continue to monitor on Zytiga.  6.  Follow-up: In 8 weeks for repeat evaluation..  30  minutes were spent on this encounter.  The time was dedicated to updating the natural course of his disease, treatment options and addressing complications related to current and future therapies.   Colin Button, MD 6/8/202112:29 PM

## 2019-09-04 ENCOUNTER — Telehealth: Payer: Self-pay | Admitting: Oncology

## 2019-09-04 ENCOUNTER — Telehealth: Payer: Self-pay

## 2019-09-04 LAB — PROSTATE-SPECIFIC AG, SERUM (LABCORP): Prostate Specific Ag, Serum: 23.6 ng/mL — ABNORMAL HIGH (ref 0.0–4.0)

## 2019-09-04 NOTE — Telephone Encounter (Signed)
Scheduled appt per 6/8 los.  Left a vm of the appt date and time.

## 2019-09-04 NOTE — Telephone Encounter (Signed)
-----   Message from Wyatt Portela, MD sent at 09/04/2019  2:42 PM EDT ----- Please let him know his PSA is down

## 2019-09-04 NOTE — Telephone Encounter (Signed)
Called patient and let him know PSA result. Patient verbalized understanding.  

## 2019-09-11 ENCOUNTER — Other Ambulatory Visit: Payer: Self-pay

## 2019-09-11 ENCOUNTER — Ambulatory Visit: Payer: Medicaid Other | Attending: Family Medicine | Admitting: Family Medicine

## 2019-09-11 ENCOUNTER — Encounter: Payer: Self-pay | Admitting: Family Medicine

## 2019-09-11 DIAGNOSIS — C61 Malignant neoplasm of prostate: Secondary | ICD-10-CM

## 2019-09-11 DIAGNOSIS — I10 Essential (primary) hypertension: Secondary | ICD-10-CM | POA: Diagnosis not present

## 2019-09-11 DIAGNOSIS — R600 Localized edema: Secondary | ICD-10-CM | POA: Diagnosis not present

## 2019-09-11 DIAGNOSIS — E7800 Pure hypercholesterolemia, unspecified: Secondary | ICD-10-CM

## 2019-09-11 DIAGNOSIS — M25562 Pain in left knee: Secondary | ICD-10-CM

## 2019-09-11 DIAGNOSIS — M25561 Pain in right knee: Secondary | ICD-10-CM

## 2019-09-11 DIAGNOSIS — G8929 Other chronic pain: Secondary | ICD-10-CM

## 2019-09-11 DIAGNOSIS — C799 Secondary malignant neoplasm of unspecified site: Secondary | ICD-10-CM

## 2019-09-11 MED ORDER — SIMVASTATIN 20 MG PO TABS: 20.0000 mg | ORAL_TABLET | Freq: Every day | ORAL | 1 refills | Status: DC

## 2019-09-11 MED ORDER — LISINOPRIL-HYDROCHLOROTHIAZIDE 20-25 MG PO TABS: 1.0000 | ORAL_TABLET | Freq: Every day | ORAL | 1 refills | Status: DC

## 2019-09-11 MED ORDER — NAPROXEN 500 MG PO TABS: 500.0000 mg | ORAL_TABLET | Freq: Two times a day (BID) | ORAL | 2 refills | Status: DC

## 2019-09-11 MED FILL — NAPROXEN 500 MG TABLET: 500 | 15 days supply | Qty: 30 | Fill #2

## 2019-09-11 MED FILL — SIMVASTATIN 20 MG TABLET: 20 | 30 days supply | Qty: 30 | Fill #0

## 2019-09-11 MED FILL — LISINOPRIL-HYDROCHLOROTHIAZ: 20-25 | 30 days supply | Qty: 30 | Fill #0

## 2019-09-11 NOTE — Progress Notes (Signed)
Virtual Visit via Telephone Note  I connected with Colin Warner, on 09/11/2019 at 2:02 PM by telephone due to the COVID-19 pandemic and verified that I am speaking with the correct person using two identifiers.   Consent: I discussed the limitations, risks, security and privacy concerns of performing an evaluation and management service by telephone and the availability of in person appointments. I also discussed with the patient that there may be a patient responsible charge related to this service. The patient expressed understanding and agreed to proceed.   Location of Patient: Work  Biomedical scientist of Provider: Clinic   Persons participating in Telemedicine visit: Dhruv Christina Farrington-CMA Dr. Margarita Rana     History of Present Illness: Colin Warner is a 59 year old male with a history of hypertension, hyperlipidemia, cancer of the prostate (Gleason score 5+4 = 9) with possible metastasis to the pelvis status post recent orchiectomy on 05/2019. He has been having pain in his legs and he is swollen from his knees to his chest and complains he is gaining weight; this has been present for the last 3 to 4 weeks. His scrotal swelling has resolved. Leg pain is in his calves; he stands a lot at work where he does intakes. He endorses drinking alcohol 40 oz/day.  Last visit with oncology was last week and there was no mention of pedal edema but his BP was elevated and he endorses compliance with his antihypertensive. He was commenced on Zytiga at that visit.  Past Medical History:  Diagnosis Date  . Alcohol dependence ([place])   . Chronic pain   . Chronic pain    knees, back, and hips  . GERD (gastroesophageal reflux disease)   . History of blood transfusion 11/2015   2 units  . History of gastritis    EGD 12-14-2015 for hematemesis;   erosive duodenopathy  . History of GI bleed    09/ 2017  upper gi bleed (hematemesis)  per EGD erosive duodenopathy,  recieved 2units PRBCs  .  History of rectal abscess    12/ 2020  s/p I&D perirectal abscess  . Hyperlipidemia   . Hypertension    followed by pcp   (05-31-2019  per pt never had a stress test)  . 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 Nocturia    No Known Allergies  Current Outpatient Medications on File Prior to Visit  Medication Sig Dispense Refill  . abiraterone acetate (ZYTIGA) 250 MG tablet Take 4 tablets (1,000 mg total) by mouth daily. Take on an empty stomach 1 hour before or 2 hours after a meal 120 tablet 0  . acetaminophen-codeine (TYLENOL #3) 300-30 MG tablet TAKE 1 TABLET BY MOUTH EVERY 12 (TWELVE) HOURS AS NEEDED FOR MODERATE PAIN. (Patient not taking: Reported on 09/11/2019) 30 tablet 0  . lisinopril-hydrochlorothiazide (ZESTORETIC) 20-25 MG tablet Take 1 tablet by mouth daily. 90 tablet 1  . acetaminophen (TYLENOL) 500 MG tablet Take 1 tablet (500 mg total) by mouth every 6 (six) hours as needed. (Patient not taking: Reported on 06/11/2019)    . naproxen (NAPROSYN) 500 MG tablet Take 1 tablet (500 mg total) by mouth 2 (two) times daily with a meal. (Patient not taking: Reported on 09/11/2019) 30 tablet 2  . omeprazole (PRILOSEC) 20 MG capsule Take 1 capsule (20 mg total) by mouth daily. (Patient not taking: Reported on 09/11/2019) 30 capsule 6  . oxyCODONE-acetaminophen (  PERCOCET/ROXICET) 5-325 MG tablet Take 1 tablet by mouth every 4 (four) hours as needed for severe pain. (Patient not taking: Reported on 09/11/2019) 15 tablet 0  . polyethylene glycol powder (GLYCOLAX/MIRALAX) 17 GM/SCOOP powder Take 17 g by mouth 2 (two) times daily as needed. (Patient not taking: Reported on 09/11/2019) 3350 g 1  . simvastatin (ZOCOR) 20 MG tablet Take 1 tablet (20 mg total) by mouth at bedtime. (Patient not taking: Reported on 09/11/2019) 90 tablet 1   No current facility-administered medications on file prior to  visit.    Observations/Objective: Awake, alert, oriented x3 Not in acute distress  Assessment and Plan: 1. 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 Essential hypertension Uncontrolled at last visit with oncology We will have him come into the office to review blood pressure and adjust regimen accordingly Counseled on blood pressure goal of less than 130/80, low-sodium, DASH diet, medication compliance, 150 minutes of moderate intensity exercise per week. Discussed medication compliance, adverse effects. - lisinopril-hydrochlorothiazide (ZESTORETIC) 20-25 MG tablet; Take 1 tablet by mouth daily.  Dispense: 90 tablet; Refill: 1  3. Pure hypercholesterolemia Controlled - simvastatin (ZOCOR) 20 MG tablet; Take 1 tablet (20 mg total) by mouth at bedtime.  Dispense: 90 tablet; Refill: 1  4. Chronic pain of both knees Stable - naproxen (NAPROSYN) 500 MG tablet; Take 1 tablet (500 mg total) by mouth 2 (two) times daily with a meal.  Dispense: 30 tablet; Refill: 2  5. Pedal edema Unknown etiology He is on his feet a lot at work Advised to obtain compression stockings Encouraged to work on quitting alcohol as he consumes significant amount of alcohol which could predispose him to liver cirrhosis   Follow Up Instructions: Return in about 2 weeks (around 09/25/2019) for In person visit for follow-up of hypertension and pedal edema.    I discussed the assessment and treatment plan with the patient. The patient was provided an opportunity to ask questions and all were answered. The patient agreed with the plan and demonstrated an understanding of the instructions.   The patient was advised to call back or seek an in-person evaluation if the symptoms worsen or if the condition fails to improve as anticipated.     I provided 17 minutes total of non-face-to-face time during this  encounter including median intraservice time, reviewing previous notes, investigations, ordering medications, medical decision making, coordinating care and patient verbalized understanding at the end of the visit.     Charlott Rakes, MD, FAAFP. [facility] and [place], [place]   09/11/2019, 2:02 PM

## 2019-09-11 NOTE — Progress Notes (Signed)
Having pain in abdomen and back of legs.  States that knees are swollen.

## 2019-09-11 NOTE — Telephone Encounter (Signed)
Oral Chemotherapy Pharmacist Encounter  Patient stopped by today to sign manufacturer assistance application and provided with Zytiga bottle to get start with while his application is in process.  Patient Education I spoke with patient in lobby for overview of new oral chemotherapy medication: Zytiga (abiraterone) for the treatment of advance prostate cancer castration-sensitive, planned duration until disease progression or unacceptable drug toxicity. Patient is s/p bilateral orchiectomy.   Counseled patient on administration, dosing, side effects, monitoring, drug-food interactions, safe handling, storage, and disposal. Patient will take 4 tablets (1,000 mg total) by mouth daily. Take on an empty stomach 1 hour before or 2 hours after a meal.  Side effects include but not limited to: fatigue, HTN, decreased wbc.    Reviewed with patient importance of keeping a medication schedule and plan for any missed doses.  Mr. Robben voiced understanding and appreciation. All questions answered. Medication handout provided  Provided patient with [facility] phone number. Patient knows to call the office with questions or concerns. Oral Chemotherapy Navigation Clinic will continue to follow.  Darl Pikes, PharmD, BCPS, BCOP, CPP Hematology/Oncology Clinical Pharmacist Practitioner [facility]/HP/AP [facility] [phone]  09/11/2019 9:28 AM

## 2019-09-12 ENCOUNTER — Other Ambulatory Visit: Payer: Self-pay | Admitting: Family Medicine

## 2019-09-12 ENCOUNTER — Telehealth: Payer: Self-pay

## 2019-09-12 DIAGNOSIS — Z9079 Acquired absence of other genital organ(s): Secondary | ICD-10-CM

## 2019-09-12 NOTE — Telephone Encounter (Signed)
Oral Oncology Patient Advocate Encounter  Met patient in lobby to complete application for Wynetta Emery and [place] in an effort to reduce patient's out of pocket expense for Zytiga to $0.    Application completed and faxed to [phone].   [employer] patient assistance phone number for follow up is [phone].   This encounter will be updated until final determination.  [place] Patient [place] Phone [phone] Fax [phone] 09/12/2019 8:32 AM

## 2019-09-19 NOTE — Telephone Encounter (Signed)
Patient is approved 09/19/19-09/18/20  [place] [facility] Patient [place] [facility] Phone [phone] Fax [phone] 09/19/2019 10:41 AM

## 2019-09-23 IMAGING — CR DG KNEE COMPLETE 4+V*L*
4 series · 4 of 4 positions shown · non-contrast
Comparison: 12/13/2015

CLINICAL DATA: Chronic left knee pain, no known injury, initial
encounter

EXAM:
LEFT KNEE - COMPLETE 4+ VIEW

[knee ap]
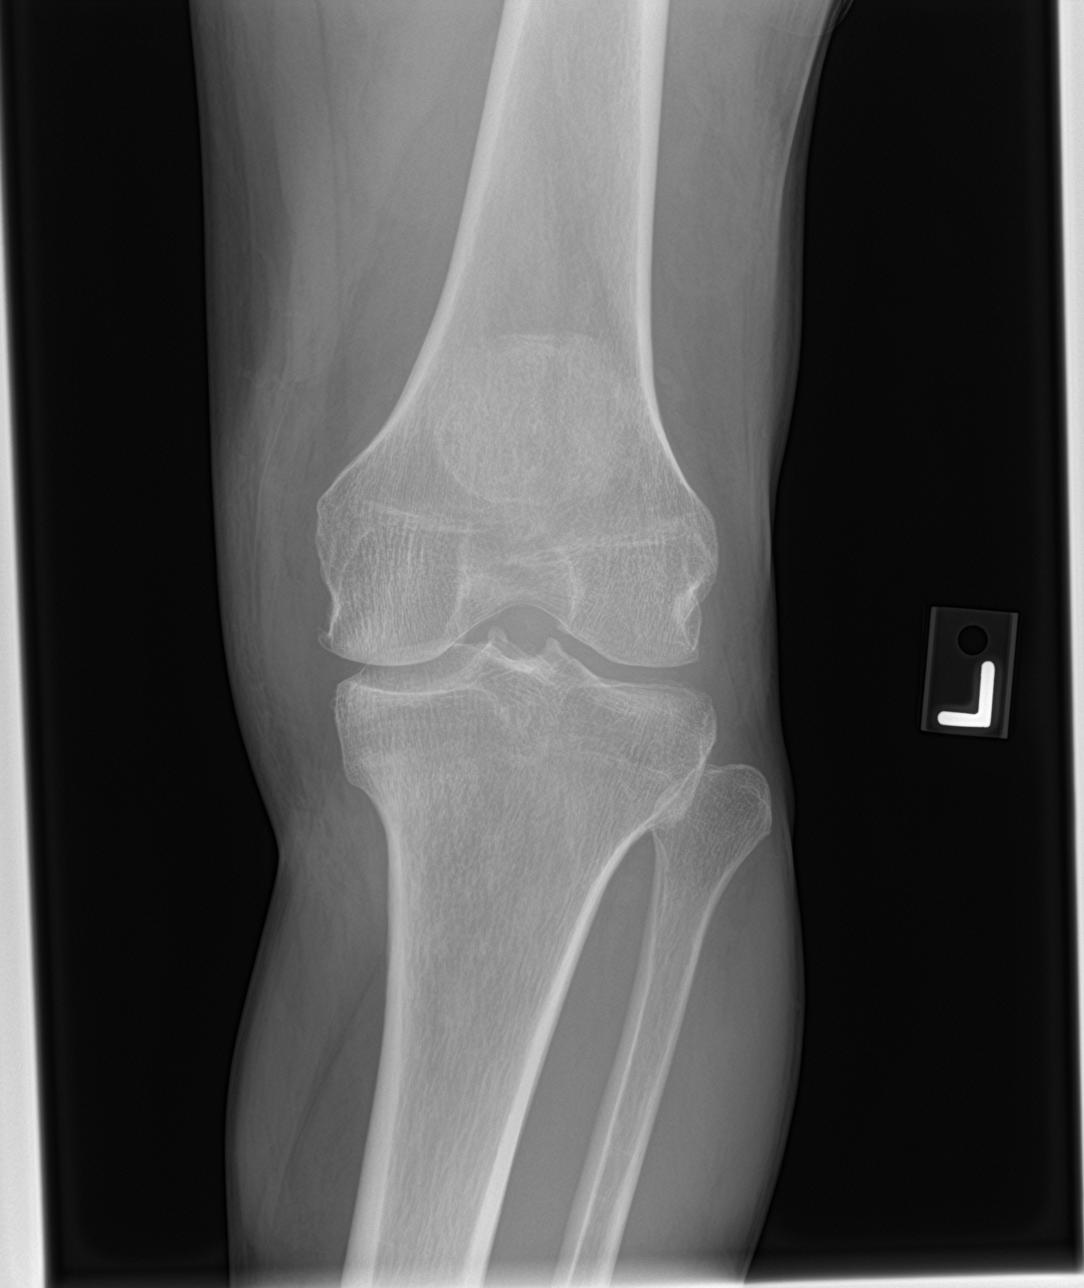

[knee lat]
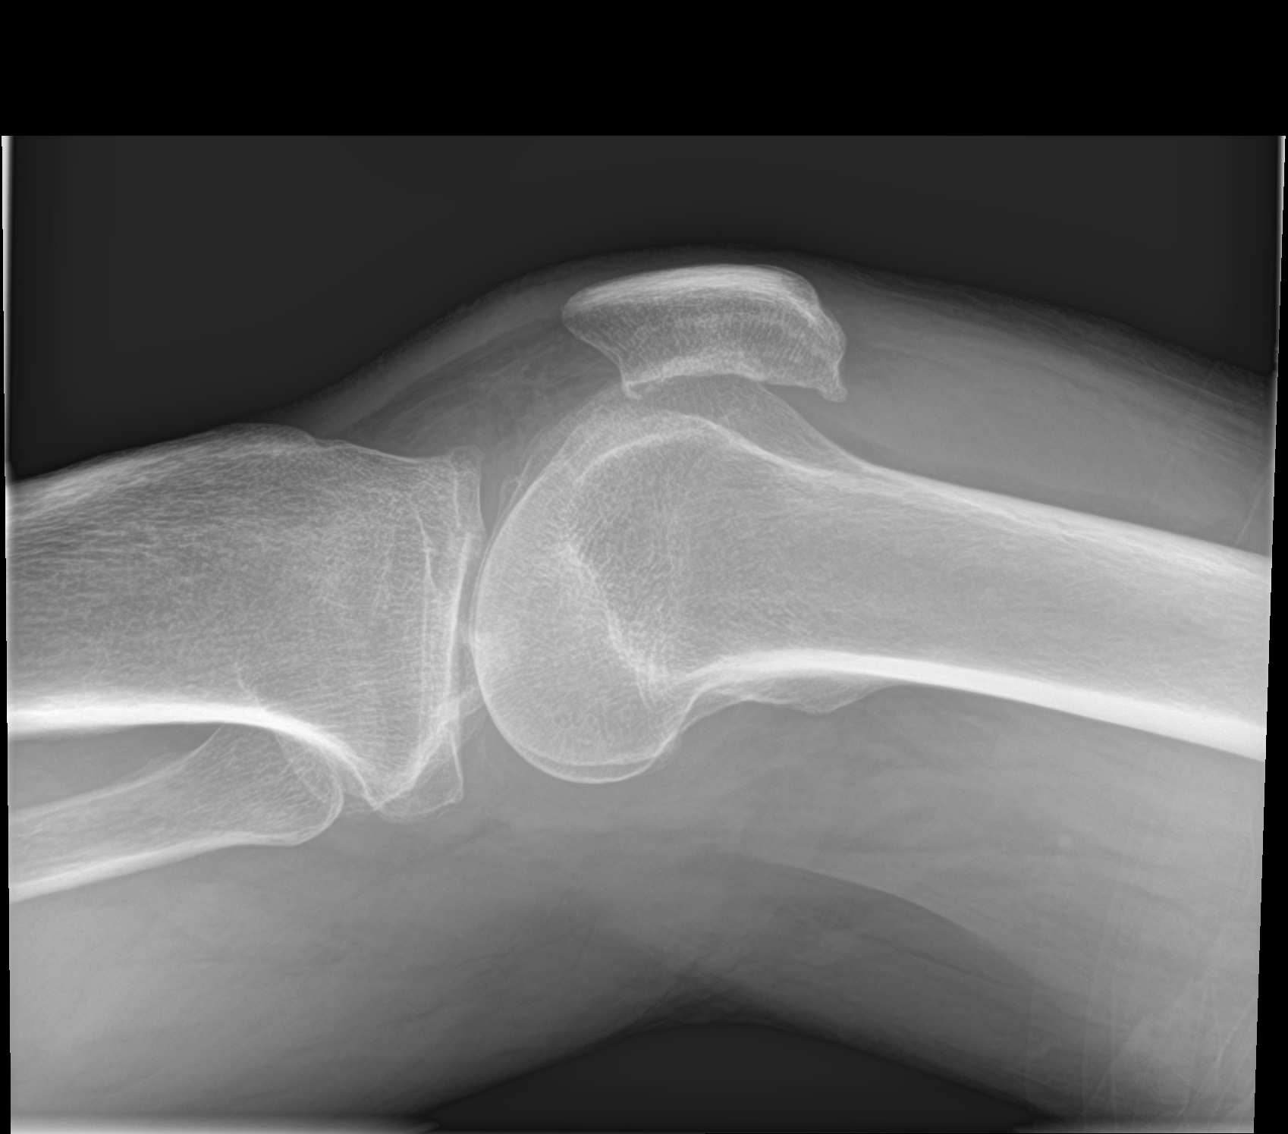

[knee obl (1 of 2)]
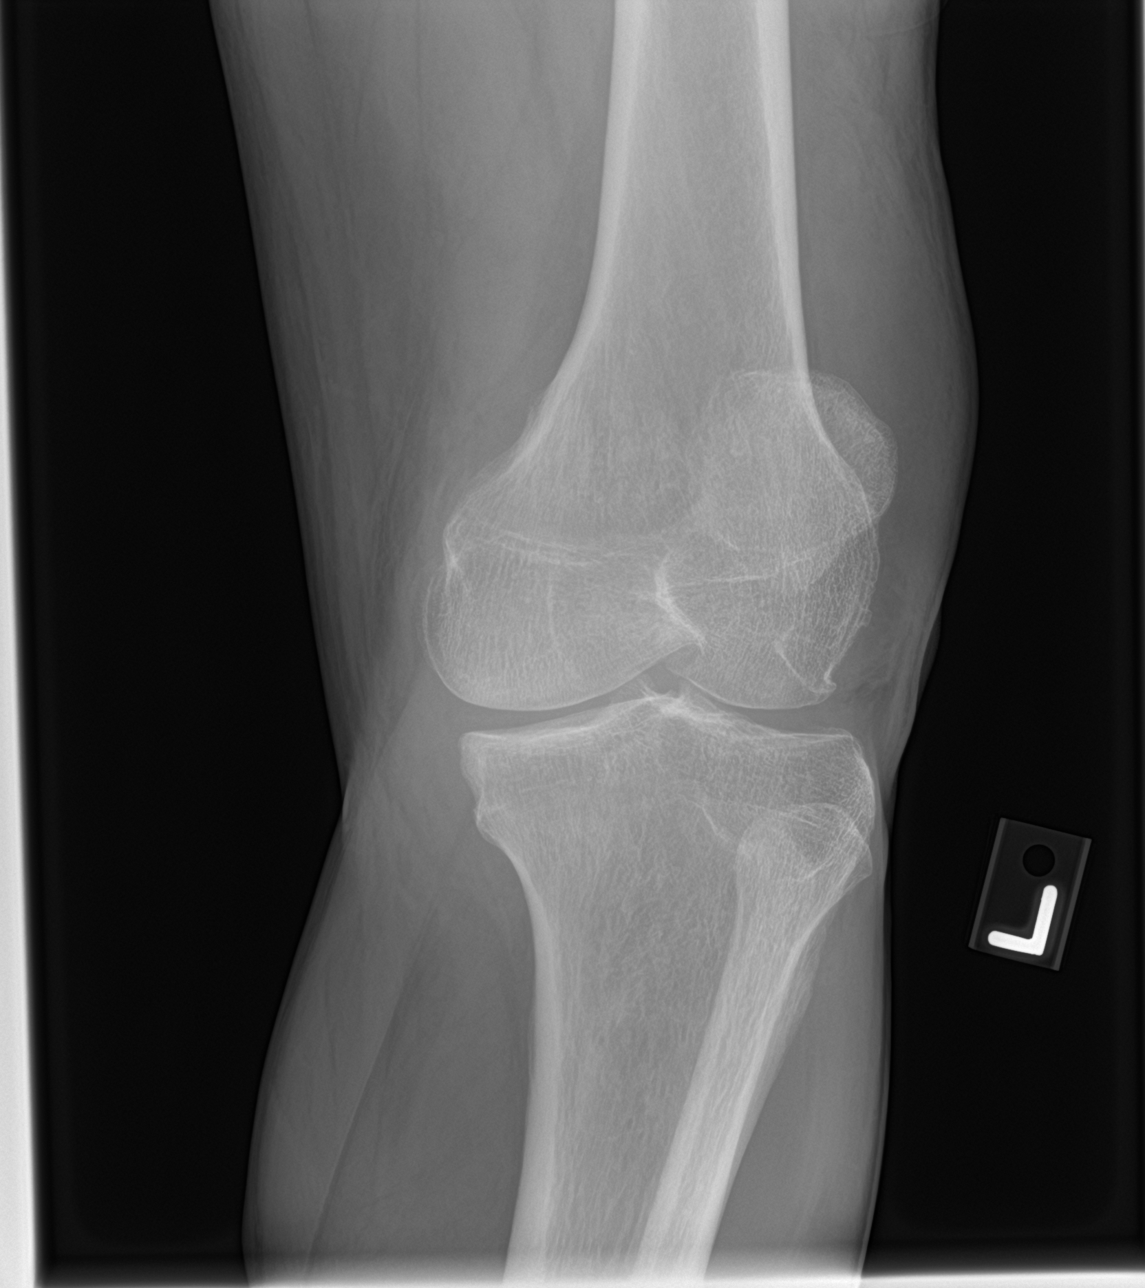

[knee obl (2 of 2)]
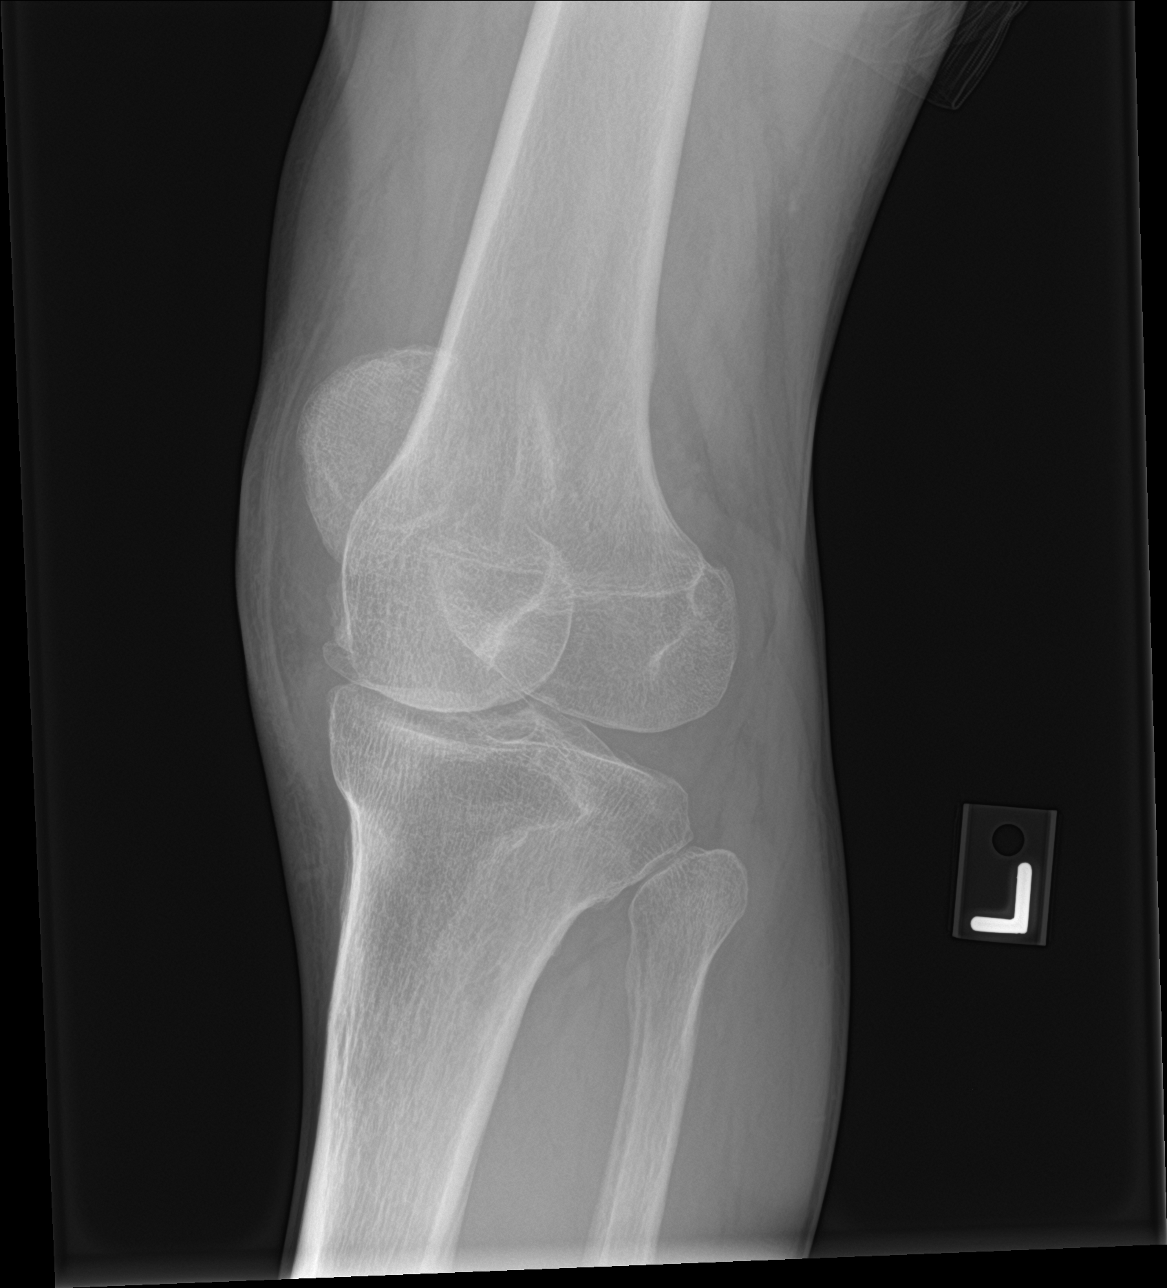

[4 of 4 positions shown; findings below may reference images not displayed]

FINDINGS: Degenerative changes are noted without acute fracture or
dislocation. Small joint effusion is noted. No other focal
abnormality is noted.
IMPRESSION: Mild degenerative change with small joint effusion. No acute bony
abnormality is seen.

## 2019-09-25 MED FILL — SIMVASTATIN 20 MG TABLET: 20 | 30 days supply | Qty: 30 | Fill #0

## 2019-09-25 MED FILL — NAPROXEN 500 MG TABLET: 500 | 30 days supply | Qty: 60 | Fill #0

## 2019-09-25 MED FILL — LISINOPRIL-HYDROCHLOROTHIAZ: 20-25 | 30 days supply | Qty: 30 | Fill #0

## 2019-10-03 ENCOUNTER — Ambulatory Visit: Payer: Self-pay | Admitting: Family Medicine

## 2019-10-17 ENCOUNTER — Telehealth: Payer: Self-pay | Admitting: Family Medicine

## 2019-10-17 ENCOUNTER — Other Ambulatory Visit: Payer: Self-pay

## 2019-10-17 MED ORDER — ABIRATERONE ACETATE 250 MG PO TABS: 1000.0000 mg | ORAL_TABLET | Freq: Every day | ORAL | 0 refills | Status: DC

## 2019-10-17 NOTE — Telephone Encounter (Signed)
Patient came into the office to inform pcp that he received a new [employer] [place] card. Patient stated that he reached out to them to inform them of who his pcp is and he was informed she wasn't connected with medicaid and attempted to place him with a different dr at a different location. Patient requested for pcp to call and verify that she is his pcp at [phone].

## 2019-10-17 NOTE — Telephone Encounter (Signed)
Noted, patient will be called once matter is cleared with medicaid.

## 2019-10-28 ENCOUNTER — Ambulatory Visit: Payer: Medicaid Other | Attending: Family Medicine | Admitting: Family Medicine

## 2019-10-28 ENCOUNTER — Other Ambulatory Visit: Payer: Self-pay

## 2019-10-28 ENCOUNTER — Other Ambulatory Visit: Payer: Self-pay | Admitting: Family Medicine

## 2019-10-28 ENCOUNTER — Encounter: Payer: Self-pay | Admitting: Family Medicine

## 2019-10-28 VITALS — BP 176/93 | HR 68 | Ht 65.0 in | Wt 153.0 lb

## 2019-10-28 DIAGNOSIS — C799 Secondary malignant neoplasm of unspecified site: Secondary | ICD-10-CM | POA: Diagnosis not present

## 2019-10-28 DIAGNOSIS — C61 Malignant neoplasm of prostate: Secondary | ICD-10-CM

## 2019-10-28 DIAGNOSIS — M25562 Pain in left knee: Secondary | ICD-10-CM

## 2019-10-28 DIAGNOSIS — K2950 Unspecified chronic gastritis without bleeding: Secondary | ICD-10-CM

## 2019-10-28 DIAGNOSIS — I10 Essential (primary) hypertension: Secondary | ICD-10-CM | POA: Diagnosis not present

## 2019-10-28 DIAGNOSIS — Z720 Tobacco use: Secondary | ICD-10-CM

## 2019-10-28 DIAGNOSIS — G8929 Other chronic pain: Secondary | ICD-10-CM

## 2019-10-28 DIAGNOSIS — M25561 Pain in right knee: Secondary | ICD-10-CM

## 2019-10-28 DIAGNOSIS — E7800 Pure hypercholesterolemia, unspecified: Secondary | ICD-10-CM | POA: Diagnosis not present

## 2019-10-28 DIAGNOSIS — R600 Localized edema: Secondary | ICD-10-CM

## 2019-10-28 MED ORDER — LISINOPRIL-HYDROCHLOROTHIAZIDE 20-12.5 MG PO TABS: 2.0000 | ORAL_TABLET | Freq: Every day | ORAL | 1 refills | Status: AC

## 2019-10-28 MED ORDER — NAPROXEN 500 MG PO TABS: 500.0000 mg | ORAL_TABLET | Freq: Two times a day (BID) | ORAL | 2 refills | Status: DC

## 2019-10-28 MED ORDER — FUROSEMIDE 20 MG PO TABS: 20.0000 mg | ORAL_TABLET | Freq: Every day | ORAL | 0 refills | Status: AC

## 2019-10-28 MED ORDER — OMEPRAZOLE 20 MG PO CPDR: 20.0000 mg | DELAYED_RELEASE_CAPSULE | Freq: Every day | ORAL | 1 refills | Status: AC

## 2019-10-28 MED ORDER — LISINOPRIL-HYDROCHLOROTHIAZIDE 20-12.5 MG PO TABS: 2.0000 | ORAL_TABLET | Freq: Every day | ORAL | 1 refills | Status: DC

## 2019-10-28 MED ORDER — SIMVASTATIN 20 MG PO TABS: 20.0000 mg | ORAL_TABLET | Freq: Every day | ORAL | 1 refills | Status: AC

## 2019-10-28 MED ORDER — NAPROXEN 500 MG PO TABS: 500.0000 mg | ORAL_TABLET | Freq: Two times a day (BID) | ORAL | 2 refills | Status: AC

## 2019-10-28 MED ORDER — SIMVASTATIN 20 MG PO TABS: 20.0000 mg | ORAL_TABLET | Freq: Every day | ORAL | 1 refills | Status: DC

## 2019-10-28 MED ORDER — FUROSEMIDE 20 MG PO TABS: 20.0000 mg | ORAL_TABLET | Freq: Every day | ORAL | 0 refills | Status: DC

## 2019-10-28 MED ORDER — METHOCARBAMOL 500 MG PO TABS: 500.0000 mg | ORAL_TABLET | Freq: Three times a day (TID) | ORAL | 1 refills | Status: AC | PRN

## 2019-10-28 MED FILL — SIMVASTATIN 20 MG TABLET: 20 | 30 days supply | Qty: 30 | Fill #0

## 2019-10-28 MED FILL — NAPROXEN 500 MG TABLET: 500 | 15 days supply | Qty: 30 | Fill #0

## 2019-10-28 MED FILL — LISINOPRIL-HCTZ 20-12.5 MG: 20-12.5 | 30 days supply | Qty: 60 | Fill #0

## 2019-10-28 MED FILL — FUROSEMIDE 20 MG TABS: 20 | 10 days supply | Qty: 10 | Fill #0

## 2019-10-28 NOTE — Telephone Encounter (Signed)
Called pt back and discussed all medications. Referred back to OV notes from today and discussed furosemide, robaxin, lisinpril-HCTZ, simvastatin and naproxen.   Pt requesting RF of omeprazole to be sent to [facility].

## 2019-10-28 NOTE — Telephone Encounter (Signed)
Pt is requesting a call back from clinic to discuss his medications. He wants clarification for what each medication is for. Please advise

## 2019-10-28 NOTE — Addendum Note (Signed)
Addended by: Carlisle Beers on: 10/28/2019 03:06 PM   Modules accepted: Orders

## 2019-10-28 NOTE — Progress Notes (Signed)
Subjective:  Patient ID: Colin Warner, male    DOB: 29-Sep-1960  Age: 59 y.o. MRN: [id number]  CC: Hypertension   HPI Vanderbilt Ranieri is a 59 year old male with a history of hypertension, hyperlipidemia, cancer of the prostate (Gleason score 5+4=9) with possible metastasis to the pelvis status post recent orchiectomy on 05/2019. He complains of pain in his legs and hips ever since he went back to work; naproxen does not provide relief. Bone scan from 04/2019 revealed: IMPRESSION: Osseous metastases involving BILATERAL ribs, LEFT T12, and RIGHT iliac bone.  Additional scattered degenerative type uptake as above with nonspecific uptake at the RIGHT lateral aspect of the upper cervical spine.  Currently on Zytiga and he sees his oncologist later this week. Also complains of pedal edema and at his last visit I had encouraged him to obtain compression stockings which he has done but complains it is too tight. His blood pressure is elevated and he endorses compliance with lisinopril/HCTZ. Smokes 5 cig/day and has cut back from 1 ppd.  Past Medical History:  Diagnosis Date  . Alcohol dependence ([place])   . Chronic pain   . Chronic pain    knees, back, and hips  . GERD (gastroesophageal reflux disease)   . History of blood transfusion 11/2015   2 units  . History of gastritis    EGD 12-14-2015 for hematemesis;   erosive duodenopathy  . History of GI bleed    09/ 2017  upper gi bleed (hematemesis)  per EGD erosive duodenopathy,  recieved 2units PRBCs  . History of rectal abscess    12/ 2020  s/p I&D perirectal abscess  . Hyperlipidemia   . Hypertension    followed by pcp   (05-31-2019  per pt never had a stress test)  . 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 Nocturia     Past Surgical History:  Procedure Laterality Date  . CYSTOSCOPY  teen   per pt for hematuria  .  ESOPHAGOGASTRODUODENOSCOPY N/A 12/14/2015   Procedure: ESOPHAGOGASTRODUODENOSCOPY (EGD);  Surgeon: Wonda Horner, MD;  Location: [facility] ENDOSCOPY;  Service: Endoscopy;  Laterality: N/A;  . INCISION AND DRAINAGE PERIRECTAL ABSCESS N/A 03/08/2019   Procedure: IRRIGATION AND DEBRIDEMENT PERINEAL ABSCESS EXAM UNDER ANESTHESIA;  Surgeon: Ileana Roup, MD;  Location: [place];  Service: General;  Laterality: N/A;  . ORCHIECTOMY Bilateral 06/05/2019   Procedure: ORCHIECTOMY;  Surgeon: Ardis Hughs, MD;  Location: [facility];  Service: Urology;  Laterality: Bilateral;    Family History  Problem Relation Age of Onset  . Hypertension Mother   . Chronic Renal Failure Mother   . Alcoholism Father     No Known Allergies  Outpatient Medications Prior to Visit  Medication Sig Dispense Refill  . abiraterone acetate (ZYTIGA) 250 MG tablet Take 4 tablets (1,000 mg total) by mouth daily. Take on an empty stomach 1 hour before or 2 hours after a meal 120 tablet 0  . acetaminophen-codeine (TYLENOL #3) 300-30 MG tablet TAKE 1 TABLET BY MOUTH EVERY 12 (TWELVE) HOURS AS NEEDED FOR MODERATE PAIN. (Patient not taking: Reported on 09/11/2019) 30 tablet 0  . polyethylene glycol powder (GLYCOLAX/MIRALAX) 17 GM/SCOOP powder Take 17 g by mouth 2 (two) times daily as needed. 3350 g 1  . lisinopril-hydrochlorothiazide (ZESTORETIC) 20-25 MG tablet Take 1 tablet by mouth daily. 90 tablet 1  . simvastatin (ZOCOR) 20 MG  tablet Take 1 tablet (20 mg total) by mouth at bedtime. 90 tablet 1  . acetaminophen (TYLENOL) 500 MG tablet Take 1 tablet (500 mg total) by mouth every 6 (six) hours as needed. (Patient not taking: Reported on 06/11/2019)    . omeprazole (PRILOSEC) 20 MG capsule Take 1 capsule (20 mg total) by mouth daily. (Patient not taking: Reported on 09/11/2019) 30 capsule 6  . oxyCODONE-acetaminophen (PERCOCET/ROXICET) 5-325 MG tablet Take 1 tablet by mouth every 4 (four) hours as needed for severe  pain. (Patient not taking: Reported on 09/11/2019) 15 tablet 0  . naproxen (NAPROSYN) 500 MG tablet Take 1 tablet (500 mg total) by mouth 2 (two) times daily with a meal. (Patient not taking: Reported on 10/28/2019) 30 tablet 2   No facility-administered medications prior to visit.     ROS Review of Systems  Constitutional: Negative for activity change and appetite change.  HENT: Negative for sinus pressure and sore throat.   Eyes: Negative for visual disturbance.  Respiratory: Negative for cough, chest tightness and shortness of breath.   Cardiovascular: Positive for leg swelling. Negative for chest pain.  Gastrointestinal: Negative for abdominal distention, abdominal pain, constipation and diarrhea.  Endocrine: Negative.   Genitourinary: Negative for dysuria.  Musculoskeletal:       See HPI  Skin: Negative for rash.  Allergic/Immunologic: Negative.   Neurological: Negative for weakness, light-headedness and numbness.  Psychiatric/Behavioral: Negative for dysphoric mood and suicidal ideas.    Objective:  BP (!) 176/93   Pulse 68   Ht 5\' 5"  (1.651 m)   Wt 153 lb (69.4 kg)   SpO2 100%   BMI 25.46 kg/m   BP/Weight 10/28/2019 0/04/4095 05/30/3297  Systolic BP 242 683 419  Diastolic BP 93 622 93  Wt. (Lbs) 153 154.2 143.5  BMI 25.46 25.66 23.88      Physical Exam Constitutional:      Appearance: He is well-developed.  Neck:     Vascular: No JVD.  Cardiovascular:     Rate and Rhythm: Normal rate.     Heart sounds: Normal heart sounds. No murmur heard.   Pulmonary:     Effort: Pulmonary effort is normal.     Breath sounds: Normal breath sounds. No wheezing or rales.  Chest:     Chest wall: No tenderness.  Abdominal:     General: Bowel sounds are normal. There is no distension.     Palpations: Abdomen is soft. There is no mass.     Tenderness: There is no abdominal tenderness.  Musculoskeletal:        General: No swelling. Normal range of motion.     Right lower leg:  No edema.     Left lower leg: No edema.     Comments: Normal range of motion in both hips and both knees Crepitus in left knee  Neurological:     Mental Status: He is alert and oriented to person, place, and time.  Psychiatric:        Mood and Affect: Mood normal.     CMP Latest Ref Rng & Units 09/03/2019 07/03/2019 06/05/2019  Glucose 70 - 99 mg/dL 105(H) 102(H) 95  BUN 6 - 20 mg/dL 14 14 11   Creatinine 0.61 - 1.24 mg/dL 1.29(H) 1.12 0.90  Sodium 135 - 145 mmol/L 139 140 139  Potassium 3.5 - 5.1 mmol/L 4.1 4.2 4.1  Chloride 98 - 111 mmol/L 101 101 100  CO2 22 - 32 mmol/L 26 28 -  Calcium 8.9 -  10.3 mg/dL 9.8 9.4 -  Total Protein 6.5 - 8.1 g/dL 8.2(H) 8.1 -  Total Bilirubin 0.3 - 1.2 mg/dL 0.6 0.3 -  Alkaline Phos 38 - 126 U/L 97 94 -  AST 15 - 41 U/L 43(H) 45(H) -  ALT 0 - 44 U/L 48(H) 30 -    Lipid Panel     Component Value Date/Time   CHOL 154 04/07/2016 0832   TRIG 66 04/07/2016 0832   HDL 77 04/07/2016 0832   CHOLHDL 2.0 04/07/2016 0832    CBC    Component Value Date/Time   WBC 4.8 09/03/2019 1222   WBC 15.5 (H) 03/07/2019 2124   RBC 5.26 09/03/2019 1222   HGB 14.2 09/03/2019 1222   HCT 43.5 09/03/2019 1222   PLT 564 (H) 09/03/2019 1222   MCV 82.7 09/03/2019 1222   MCH 27.0 09/03/2019 1222   MCHC 32.6 09/03/2019 1222   RDW 15.0 09/03/2019 1222   LYMPHSABS 1.9 09/03/2019 1222   MONOABS 0.4 09/03/2019 1222   EOSABS 0.0 09/03/2019 1222   BASOSABS 0.0 09/03/2019 1222    No results found for: HGBA1C  Assessment & Plan:   1. Essential hypertension Uncontrolled Effect of Zytiga also contributory Increased lisinopril/HCTZ 20/25 to 40/45 Counseled on blood pressure goal of less than 130/80, low-sodium, DASH diet, medication compliance, 150 minutes of moderate intensity exercise per week. Discussed medication compliance, adverse effects. - lisinopril-hydrochlorothiazide (ZESTORETIC) 20-12.5 MG tablet; Take 2 tablets by mouth daily.  Dispense: 180 tablet; Refill:  1  2. 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 Pedal edema No pedal edema evident on my exam Advised to continue with his compression stockings which he currently has Short course of Lasix added - furosemide (LASIX) 20 MG tablet; Take 1 tablet (20 mg total) by mouth daily.  Dispense: 10 tablet; Refill: 0  4. Pure hypercholesterolemia Controlled - simvastatin (ZOCOR) 20 MG tablet; Take 1 tablet (20 mg total) by mouth at bedtime.  Dispense: 90 tablet; Refill: 1  5. Chronic pain of both knees Uncontrolled Bony pains also form when osseous metastasis - methocarbamol (ROBAXIN) 500 MG tablet; Take 1 tablet (500 mg total) by mouth every 8 (eight) hours as needed for muscle spasms.  Dispense: 60 tablet; Refill: 1 - naproxen (NAPROSYN) 500 MG tablet; Take 1 tablet (500 mg total) by mouth 2 (two) times daily with a meal.  Dispense: 30 tablet; Refill: 2   Meds ordered this encounter  Medications  . DISCONTD: lisinopril-hydrochlorothiazide (ZESTORETIC) 20-12.5 MG tablet    Sig: Take 2 tablets by mouth daily.    Dispense:  180 tablet    Refill:  1    Discontinue Lisinopril/HCTZ 20/25  . methocarbamol (ROBAXIN) 500 MG tablet    Sig: Take 1 tablet (500 mg total) by mouth every 8 (eight) hours as needed for muscle spasms.    Dispense:  60 tablet    Refill:  1  . DISCONTD: furosemide (LASIX) 20 MG tablet    Sig: Take 1 tablet (20 mg total) by mouth daily.    Dispense:  10 tablet    Refill:  0  . lisinopril-hydrochlorothiazide (ZESTORETIC) 20-12.5 MG tablet    Sig: Take 2 tablets by mouth daily.    Dispense:  180 tablet    Refill:  1  Discontinue Lisinopril/HCTZ 20/25  . naproxen (NAPROSYN) 500 MG tablet    Sig: Take 1 tablet (500 mg total) by mouth  2 (two) times daily with a meal.    Dispense:  30 tablet    Refill:  2  . furosemide (LASIX) 20 MG tablet    Sig: Take 1 tablet (20 mg total) by mouth daily.    Dispense:  10 tablet    Refill:  0  . simvastatin (ZOCOR) 20 MG tablet    Sig: Take 1 tablet (20 mg total) by mouth at bedtime.    Dispense:  90 tablet    Refill:  1    Follow-up: Return in about 3 months (around 01/28/2020) for Chronic disease management.       Charlott Rakes, MD, FAAFP. [facility] and [place], [place]   10/28/2019, 10:39 AM

## 2019-10-28 NOTE — Progress Notes (Signed)
Still having pain in legs and hips.

## 2019-10-28 NOTE — Telephone Encounter (Signed)
Pt called and asked to have his refills from today sent to  [place], [place] Phone:  [phone]  Fax:  [phone]     Instead of office pharmacy/ please advise

## 2019-10-28 NOTE — Patient Instructions (Signed)
Edema  Edema is when you have too much fluid in your body or under your skin. Edema may make your legs, feet, and ankles swell up. Swelling is also common in looser tissues, like around your eyes. This is a common condition. It gets more common as you get older. There are many possible causes of edema. Eating too much salt (sodium) and being on your feet or sitting for a long time can cause edema in your legs, feet, and ankles. Hot weather may make edema worse. Edema is usually painless. Your skin may look swollen or shiny. Follow these instructions at home:  Keep the swollen body part raised (elevated) above the level of your heart when you are sitting or lying down.  Do not sit still or stand for a long time.  Do not wear tight clothes. Do not wear garters on your upper legs.  Exercise your legs. This can help the swelling go down.  Wear elastic bandages or support stockings as told by your doctor.  Eat a low-salt (low-sodium) diet to reduce fluid as told by your doctor.  Depending on the cause of your swelling, you may need to limit how much fluid you drink (fluid restriction).  Take over-the-counter and prescription medicines only as told by your doctor. Contact a doctor if:  Treatment is not working.  You have heart, liver, or kidney disease and have symptoms of edema.  You have sudden and unexplained weight gain. Get help right away if:  You have shortness of breath or chest pain.  You cannot breathe when you lie down.  You have pain, redness, or warmth in the swollen areas.  You have heart, liver, or kidney disease and get edema all of a sudden.  You have a fever and your symptoms get worse all of a sudden. Summary  Edema is when you have too much fluid in your body or under your skin.  Edema may make your legs, feet, and ankles swell up. Swelling is also common in looser tissues, like around your eyes.  Raise (elevate) the swollen body part above the level of your  heart when you are sitting or lying down.  Follow your doctor's instructions about diet and how much fluid you can drink (fluid restriction). This information is not intended to replace advice given to you by your health care provider. Make sure you discuss any questions you have with your health care provider. Document Revised: 03/17/2017 Document Reviewed: 04/01/2016 Elsevier Patient Education  2020 Elsevier Inc.  

## 2019-10-28 NOTE — Telephone Encounter (Signed)
Called pt back and he stated that his prescription were already ordered today but he wants to send to [facility]. [employer] and no option given other than to LM on VM. Left message and included pt's name, DOB, phone number and meds to be transferred. Attempted to call [facility] pharmacy twice but no answer.  Spoke with pharmacist at [facility] who received all but 2 rx. Sending Simvastatin and naproxen

## 2019-11-01 ENCOUNTER — Telehealth: Payer: Self-pay | Admitting: *Deleted

## 2019-11-01 NOTE — Telephone Encounter (Signed)
Pt called requesting to skip dose of Zytiga until sharp pains in abdomen is better. Per Dr.Shadad, OK to skip until pain is better. Pt made aware and verbalized understanding.

## 2019-11-05 ENCOUNTER — Other Ambulatory Visit: Payer: Self-pay

## 2019-11-05 ENCOUNTER — Telehealth: Payer: Self-pay

## 2019-11-05 ENCOUNTER — Inpatient Hospital Stay: Payer: Medicaid Other

## 2019-11-05 ENCOUNTER — Other Ambulatory Visit: Payer: Self-pay | Admitting: Pharmacist

## 2019-11-05 ENCOUNTER — Inpatient Hospital Stay: Payer: Medicaid Other | Attending: Oncology | Admitting: Oncology

## 2019-11-05 VITALS — BP 173/104 | HR 76 | Temp 96.8°F | Resp 18 | Ht 65.0 in | Wt 151.6 lb

## 2019-11-05 DIAGNOSIS — Z79899 Other long term (current) drug therapy: Secondary | ICD-10-CM | POA: Diagnosis not present

## 2019-11-05 DIAGNOSIS — Z9079 Acquired absence of other genital organ(s): Secondary | ICD-10-CM | POA: Insufficient documentation

## 2019-11-05 DIAGNOSIS — C7951 Secondary malignant neoplasm of bone: Secondary | ICD-10-CM | POA: Insufficient documentation

## 2019-11-05 DIAGNOSIS — I10 Essential (primary) hypertension: Secondary | ICD-10-CM | POA: Insufficient documentation

## 2019-11-05 DIAGNOSIS — C61 Malignant neoplasm of prostate: Secondary | ICD-10-CM | POA: Diagnosis present

## 2019-11-05 DIAGNOSIS — R609 Edema, unspecified: Secondary | ICD-10-CM | POA: Insufficient documentation

## 2019-11-05 DIAGNOSIS — R109 Unspecified abdominal pain: Secondary | ICD-10-CM | POA: Insufficient documentation

## 2019-11-05 LAB — CMP (CANCER CENTER ONLY)
ALT: 40 U/L (ref 0–44)
AST: 29 U/L (ref 15–41)
Albumin: 4.3 g/dL (ref 3.5–5.0)
Alkaline Phosphatase: 108 U/L (ref 38–126)
Anion gap: 10 (ref 5–15)
BUN: 22 mg/dL — ABNORMAL HIGH (ref 6–20)
CO2: 32 mmol/L (ref 22–32)
Calcium: 10.6 mg/dL — ABNORMAL HIGH (ref 8.9–10.3)
Chloride: 100 mmol/L (ref 98–111)
Creatinine: 1.24 mg/dL (ref 0.61–1.24)
GFR, Est AFR Am: >60 mL/min (ref >60)
GFR, Estimated: >60 mL/min (ref >60)
Glucose, Bld: 82 mg/dL (ref 70–99)
Potassium: 4.4 mmol/L (ref 3.5–5.1)
Sodium: 142 mmol/L (ref 135–145)
Total Bilirubin: 0.4 mg/dL (ref 0.3–1.2)
Total Protein: 8.5 g/dL — ABNORMAL HIGH (ref 6.5–8.1)

## 2019-11-05 LAB — CBC WITH DIFFERENTIAL (CANCER CENTER ONLY)
Abs Immature Granulocytes: 0.02 10*3/uL (ref 0.00–0.07)
Basophils Absolute: 0 10*3/uL (ref 0.0–0.1)
Basophils Relative: 0 %
Eosinophils Absolute: 0.1 10*3/uL (ref 0.0–0.5)
Eosinophils Relative: 2 %
HCT: 43 % (ref 39.0–52.0)
Hemoglobin: 14.1 g/dL (ref 13.0–17.0)
Immature Granulocytes: 0 %
Lymphocytes Relative: 41 %
Lymphs Abs: 2.2 10*3/uL (ref 0.7–4.0)
MCH: 28 pg (ref 26.0–34.0)
MCHC: 32.8 g/dL (ref 30.0–36.0)
MCV: 85.3 fL (ref 80.0–100.0)
Monocytes Absolute: 0.5 10*3/uL (ref 0.1–1.0)
Monocytes Relative: 10 %
Neutro Abs: 2.5 10*3/uL (ref 1.7–7.7)
Neutrophils Relative %: 47 %
Platelet Count: 513 10*3/uL — ABNORMAL HIGH (ref 150–400)
RBC: 5.04 MIL/uL (ref 4.22–5.81)
RDW: 16.3 % — ABNORMAL HIGH (ref 11.5–15.5)
WBC Count: 5.4 10*3/uL (ref 4.0–10.5)
nRBC: 0 % (ref 0.0–0.2)

## 2019-11-05 MED ORDER — ABIRATERONE ACETATE 250 MG PO TABS: 1000.0000 mg | ORAL_TABLET | Freq: Every day | ORAL | 0 refills | Status: AC

## 2019-11-05 NOTE — Progress Notes (Signed)
Oral Oncology Pharmacist Encounter  Spoke with [facility] pharmacy, who is contracted with Wynetta Emery and [employer] assistance program regarding questions about Zytiga refill. Notified that [facility] had put patient's medication on hold after they had not received return fax for refill request.  Fabio Asa has now been redirected to [facility] for fill. Patient's pharmacy preferences updated.  Patient called and updated about medication.   Leron Croak, PharmD, BCPS Hematology/Oncology Clinical Pharmacist [facility] [phone] 11/05/2019 12:57 PM

## 2019-11-05 NOTE — Progress Notes (Signed)
Hematology and Oncology Follow Up Visit  Colin Warner [id number] 1960/10/12 59 y.o. 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  Prior Therapy:  He is status post bilateral orchiectomy completed on June 05, 2019.  Current therapy:  Zytiga 1000 mg daily started in June 2021.  Interim History: Colin Warner returns today for a follow-up evaluation.  Since the last visit, he started Zytiga and tolerated the first months of therapy without any complications.  He did developed abdominal pain and edema last week and stop the medication temporarily.  He has restarted it today at 500 mg daily and so far was well-tolerated.  He is not reporting any nausea, vomiting or abdominal pain.  He denies any weight loss or appetite changes.  Continues to work full-time without any decline in ability to do so..     Medications: Unchanged on review. Current Outpatient Medications  Medication Sig Dispense Refill  . abiraterone acetate (ZYTIGA) 250 MG tablet Take 4 tablets (1,000 mg total) by mouth daily. Take on an empty stomach 1 hour before or 2 hours after a meal 120 tablet 0  . acetaminophen (TYLENOL) 500 MG tablet Take 1 tablet (500 mg total) by mouth every 6 (six) hours as needed. (Patient not taking: Reported on 06/11/2019)    . furosemide (LASIX) 20 MG tablet Take 1 tablet (20 mg total) by mouth daily. 10 tablet 0  . lisinopril-hydrochlorothiazide (ZESTORETIC) 20-12.5 MG tablet Take 2 tablets by mouth daily. 180 tablet 1  . methocarbamol (ROBAXIN) 500 MG tablet Take 1 tablet (500 mg total) by mouth every 8 (eight) hours as needed for muscle spasms. 60 tablet 1  . naproxen (NAPROSYN) 500 MG tablet Take 1 tablet (500 mg total) by mouth 2 (two) times daily with a meal. 30 tablet 2  . omeprazole (PRILOSEC) 20 MG capsule Take 1 capsule (20 mg total)  by mouth daily. 90 capsule 1  . simvastatin (ZOCOR) 20 MG tablet Take 1 tablet (20 mg total) by mouth at bedtime. 90 tablet 1   No current facility-administered medications for this visit.     Allergies: No Known Allergies    Physical Exam: Blood pressure (!) 173/104, pulse 76, temperature (!) 96.8 F (36 C), temperature source Tympanic, resp. rate 18, height 5\' 5"  (1.651 m), weight 151 lb 9.6 oz (68.8 kg), SpO2 100 %.    ECOG: 1    General appearance: Comfortable appearing without any discomfort Head: Normocephalic without any trauma Oropharynx: Mucous membranes are moist and pink without any thrush or ulcers. Eyes: Pupils are equal and round reactive to light. Lymph nodes: No cervical, supraclavicular, inguinal or axillary lymphadenopathy.   Heart:regular rate and rhythm.  S1 and S2 without leg edema. Lung: Clear without any rhonchi or wheezes.  No dullness to percussion. Abdomin: Soft, nontender, nondistended with good bowel sounds.  No hepatosplenomegaly. Musculoskeletal: No joint deformity or effusion.  Full range of motion noted. Neurological: No deficits noted on motor, sensory and deep tendon reflex exam. Skin: No petechial rash or dryness.  Appeared moist.        Lab Results: Lab Results  Component Value Date   WBC 5.4 11/05/2019   HGB 14.1 11/05/2019   HCT 43.0 11/05/2019   MCV 85.3 11/05/2019   PLT 513 (H) 11/05/2019     Chemistry      Component Value Date/Time   NA 142  11/05/2019 1117   NA 140 08/04/2017 1641   K 4.4 11/05/2019 1117   CL 100 11/05/2019 1117   CO2 32 11/05/2019 1117   BUN 22 (H) 11/05/2019 1117   BUN 13 08/04/2017 1641   CREATININE 1.24 11/05/2019 1117   CREATININE 0.99 04/07/2016 0832      Component Value Date/Time   CALCIUM 10.6 (H) 11/05/2019 1117   ALKPHOS 108 11/05/2019 1117   AST 29 11/05/2019 1117   ALT 40 11/05/2019 1117   BILITOT 0.4 11/05/2019 1117         Impression and Plan:    59 year old man  with:  1.    Advanced prostate cancer with disease to the bone diagnosed in January 2021.  He has castration-sensitive at this time.  He is currently on Zytiga and after reasonably well-tolerated initial therapy currently is experiencing more complications.  Risks and benefits of continuing this treatment were reviewed today potential complications including hypertension, edema and GI toxicity were discussed.  He is agreeable to continue with attempts to continue the current dose of 500 mg daily.  Alternative options would be switching to [facility] or systemic chemotherapy.  2.  Androgen deprivation therapy: He is status post orchiectomy without any additional treatment needed.  3.  Bone directed therapy: Delton See has been deferred for the time being till he obtains dental clearance.  We will continue to address that with him future visits.  4.  Pain: Manageable at this time and does not require any pain medication.  5.  Hypertension: Blood pressure medication adjusted and will continue to monitor on Zytiga.  6.  Follow-up: He will return in 2 months for repeat evaluation.  30  minutes were dedicated to this visit.  Time was spent on reviewing his disease status, discussing treatment options and addressing complications related to therapy.  Zola Button, MD 8/10/202112:06 PM

## 2019-11-05 NOTE — Telephone Encounter (Signed)
Patient in office today for MD visit and stated "I haven't gotten my Zytiga in months". Patient stated he has financial assistance. Per last note on 09/19/19 from Wynn Maudlin, CPhT, patient was approved for assistance. Called and spoke to [place] at [place] to see if prescription had been filled in July 2021. Per Einar Pheasant, someone from [place] will reach out to patient regarding prescription refill.

## 2019-11-06 ENCOUNTER — Telehealth: Payer: Self-pay | Admitting: Oncology

## 2019-11-06 ENCOUNTER — Telehealth: Payer: Self-pay | Admitting: *Deleted

## 2019-11-06 LAB — PROSTATE-SPECIFIC AG, SERUM (LABCORP): Prostate Specific Ag, Serum: 0.9 ng/mL (ref 0.0–4.0)

## 2019-11-06 NOTE — Telephone Encounter (Signed)
-----   Message from Wyatt Portela, MD sent at 11/06/2019  8:46 AM EDT ----- Please let him know his PSA is down again.

## 2019-11-06 NOTE — Telephone Encounter (Signed)
Scheduled per 08/10 los, patient has been called and voicemail was left. 

## 2019-11-06 NOTE — Telephone Encounter (Signed)
Notified of message below

## 2019-11-08 ENCOUNTER — Other Ambulatory Visit: Payer: Self-pay

## 2019-11-08 ENCOUNTER — Encounter (HOSPITAL_COMMUNITY): Payer: Self-pay | Admitting: Emergency Medicine

## 2019-11-08 ENCOUNTER — Emergency Department (HOSPITAL_COMMUNITY)
Admission: EM | Admit: 2019-11-08 | Discharge: 2019-11-27 | Disposition: E | Payer: Medicaid Other | Attending: Emergency Medicine | Admitting: Emergency Medicine

## 2019-11-08 DIAGNOSIS — R55 Syncope and collapse: Secondary | ICD-10-CM | POA: Diagnosis present

## 2019-11-08 DIAGNOSIS — Y999 Unspecified external cause status: Secondary | ICD-10-CM | POA: Diagnosis not present

## 2019-11-08 DIAGNOSIS — Y939 Activity, unspecified: Secondary | ICD-10-CM | POA: Diagnosis not present

## 2019-11-08 DIAGNOSIS — I468 Cardiac arrest due to other underlying condition: Secondary | ICD-10-CM | POA: Diagnosis not present

## 2019-11-08 DIAGNOSIS — Y92410 Unspecified street and highway as the place of occurrence of the external cause: Secondary | ICD-10-CM | POA: Diagnosis not present

## 2019-11-08 DIAGNOSIS — S0101XA Laceration without foreign body of scalp, initial encounter: Secondary | ICD-10-CM | POA: Insufficient documentation

## 2019-11-08 MED ORDER — EPINEPHRINE 1 MG/10ML IJ SOSY
PREFILLED_SYRINGE | INTRAMUSCULAR | Status: AC | PRN
  Administered 2019-11-08 (×3): 1 via INTRAVENOUS
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
## 2019-11-08 NOTE — ED Notes (Signed)
EDP updating spouse
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
## 2019-11-08 NOTE — Progress Notes (Signed)
Chaplain responded to Level 1.  Staff in room.  Family/friends not present.  Chaplain will be available if they arrive. Rev. Tamsen Snider Pager [phone]

## 2019-11-08 NOTE — Code Documentation (Signed)
Patient time of death occurred at 2227/06/30. By Dr. Darl Householder
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
## 2019-11-09 MED FILL — Medication: Qty: 1 | Status: AC

## 2019-11-11 ENCOUNTER — Encounter: Payer: Self-pay | Admitting: Family Medicine

## 2019-11-27 NOTE — Code Documentation (Signed)
Dr. Darl Householder, at bedside with ultrasound, no cardiac activity

## 2019-11-27 NOTE — Code Documentation (Signed)
Dr. Eric Form placed chest tube on R. Hooked to continuous high suction

## 2019-11-27 NOTE — Code Documentation (Signed)
Linton Rump off, compressions resumed

## 2019-11-27 NOTE — ED Notes (Signed)
Large lac to posterior head

## 2019-11-27 NOTE — Code Documentation (Signed)
Pulse check, no pulse, continued PEA

## 2019-11-27 NOTE — ED Provider Notes (Signed)
  Physical Exam  BP (!) 0/0   Pulse (!) 119   Temp (!) 95.3 F (35.2 C) (Temporal)   Resp (!) 0   Ht 6' (1.829 m)   Wt 81.6 kg   SpO2 (!) 41%   BMI 24.41 kg/m   Physical Exam  ED Course/Procedures     .Central Line  Date/Time: 11/12/2019 3:41 PM Performed by: Drenda Freeze, MD Authorized by: Drenda Freeze, MD   Consent:    Consent obtained:  Verbal   Consent given by:  Patient   Risks discussed:  Arterial puncture Universal protocol:    Procedure explained and questions answered to patient or proxy's satisfaction: yes     Relevant documents present and verified: yes   Pre-procedure details:    Hand hygiene: Hand hygiene performed prior to insertion   Procedure details:    Location:  R femoral   Patient position:  Flat   Catheter size:  9.5 Fr   Landmarks identified: yes     Ultrasound guidance: yes     Number of attempts:  1   Successful placement: yes   Post-procedure details:    Post-procedure:  Dressing applied   Assessment:  Blood return through all ports   Patient tolerance of procedure:  Tolerated well, no immediate complications Comments:     This was done emergently and not under sterile condition. Patient has no access and is in cardiac arrest     MDM         Drenda Freeze, MD 11/12/19 [phone]

## 2019-11-27 NOTE — ED Notes (Signed)
Unable to obtain manual d/t CPR in process and chest tubes being placed

## 2019-11-27 NOTE — Code Documentation (Signed)
Dr. Darl Householder with resident Dr. Eric Form at bedside intubating

## 2019-11-27 DEATH — deceased

## 2020-01-07 ENCOUNTER — Other Ambulatory Visit: Payer: Medicaid Other

## 2020-01-07 ENCOUNTER — Ambulatory Visit: Payer: Medicaid Other | Admitting: Oncology
